# Patient Record
Sex: Female | Born: 1965 | Race: White | Hispanic: No | Smoking: Current every day smoker
Health system: Southern US, Community
[De-identification: ages and names within clinical notes are randomized; demographics above are authoritative.]

## PROBLEM LIST (undated history)

## (undated) DIAGNOSIS — T8859XA Other complications of anesthesia, initial encounter: Secondary | ICD-10-CM

## (undated) DIAGNOSIS — T4145XA Adverse effect of unspecified anesthetic, initial encounter: Secondary | ICD-10-CM

## (undated) DIAGNOSIS — M797 Fibromyalgia: Secondary | ICD-10-CM

## (undated) DIAGNOSIS — F419 Anxiety disorder, unspecified: Secondary | ICD-10-CM

## (undated) DIAGNOSIS — N289 Disorder of kidney and ureter, unspecified: Secondary | ICD-10-CM

## (undated) HISTORY — PX: CHOLECYSTECTOMY: SHX55

## (undated) HISTORY — PX: BREAST SURGERY: SHX581

## (undated) HISTORY — PX: LITHOTRIPSY: SUR834

---

## 1987-01-12 HISTORY — PX: WISDOM TOOTH EXTRACTION: SHX21

## 1999-01-23 ENCOUNTER — Other Ambulatory Visit: Admission: RE | Admit: 1999-01-23 | Discharge: 1999-01-23 | Payer: Self-pay | Admitting: Obstetrics and Gynecology

## 1999-02-19 ENCOUNTER — Encounter (INDEPENDENT_AMBULATORY_CARE_PROVIDER_SITE_OTHER): Payer: Self-pay

## 1999-02-19 ENCOUNTER — Observation Stay (HOSPITAL_COMMUNITY): Admission: RE | Admit: 1999-02-19 | Discharge: 1999-02-20 | Payer: Self-pay | Admitting: Surgery

## 1999-02-19 ENCOUNTER — Encounter: Payer: Self-pay | Admitting: Surgery

## 2000-02-29 ENCOUNTER — Other Ambulatory Visit: Admission: RE | Admit: 2000-02-29 | Discharge: 2000-02-29 | Payer: Self-pay | Admitting: Obstetrics and Gynecology

## 2001-05-15 ENCOUNTER — Inpatient Hospital Stay (HOSPITAL_COMMUNITY): Admission: AD | Admit: 2001-05-15 | Discharge: 2001-05-15 | Payer: Self-pay | Admitting: *Deleted

## 2001-05-22 ENCOUNTER — Other Ambulatory Visit: Admission: RE | Admit: 2001-05-22 | Discharge: 2001-05-22 | Payer: Self-pay | Admitting: Obstetrics and Gynecology

## 2001-06-04 ENCOUNTER — Inpatient Hospital Stay (HOSPITAL_COMMUNITY): Admission: AD | Admit: 2001-06-04 | Discharge: 2001-06-06 | Payer: Self-pay | Admitting: *Deleted

## 2001-06-06 ENCOUNTER — Ambulatory Visit (HOSPITAL_BASED_OUTPATIENT_CLINIC_OR_DEPARTMENT_OTHER): Admission: RE | Admit: 2001-06-06 | Discharge: 2001-06-06 | Payer: Self-pay | Admitting: Urology

## 2001-11-26 ENCOUNTER — Inpatient Hospital Stay (HOSPITAL_COMMUNITY): Admission: AD | Admit: 2001-11-26 | Discharge: 2001-11-26 | Payer: Self-pay | Admitting: Obstetrics and Gynecology

## 2001-11-27 ENCOUNTER — Inpatient Hospital Stay (HOSPITAL_COMMUNITY): Admission: AD | Admit: 2001-11-27 | Discharge: 2001-11-29 | Payer: Self-pay | Admitting: Obstetrics and Gynecology

## 2001-12-01 ENCOUNTER — Ambulatory Visit (HOSPITAL_BASED_OUTPATIENT_CLINIC_OR_DEPARTMENT_OTHER): Admission: RE | Admit: 2001-12-01 | Discharge: 2001-12-01 | Payer: Self-pay | Admitting: *Deleted

## 2001-12-25 ENCOUNTER — Ambulatory Visit (HOSPITAL_BASED_OUTPATIENT_CLINIC_OR_DEPARTMENT_OTHER): Admission: RE | Admit: 2001-12-25 | Discharge: 2001-12-25 | Payer: Self-pay | Admitting: Urology

## 2001-12-28 ENCOUNTER — Ambulatory Visit (HOSPITAL_BASED_OUTPATIENT_CLINIC_OR_DEPARTMENT_OTHER): Admission: RE | Admit: 2001-12-28 | Discharge: 2001-12-28 | Payer: Self-pay | Admitting: Urology

## 2001-12-28 ENCOUNTER — Encounter: Payer: Self-pay | Admitting: Urology

## 2002-01-09 ENCOUNTER — Other Ambulatory Visit: Admission: RE | Admit: 2002-01-09 | Discharge: 2002-01-09 | Payer: Self-pay | Admitting: Obstetrics and Gynecology

## 2003-01-12 HISTORY — PX: BREAST SURGERY: SHX581

## 2003-01-18 ENCOUNTER — Other Ambulatory Visit: Admission: RE | Admit: 2003-01-18 | Discharge: 2003-01-18 | Payer: Self-pay | Admitting: Obstetrics and Gynecology

## 2003-12-09 ENCOUNTER — Ambulatory Visit: Payer: Self-pay | Admitting: Family Medicine

## 2004-01-12 HISTORY — PX: DILATION AND CURETTAGE OF UTERUS: SHX78

## 2004-01-23 ENCOUNTER — Ambulatory Visit: Payer: Self-pay | Admitting: Family Medicine

## 2004-02-11 ENCOUNTER — Other Ambulatory Visit: Admission: RE | Admit: 2004-02-11 | Discharge: 2004-02-11 | Payer: Self-pay | Admitting: Obstetrics and Gynecology

## 2004-03-12 ENCOUNTER — Ambulatory Visit: Payer: Self-pay | Admitting: Family Medicine

## 2004-05-13 ENCOUNTER — Ambulatory Visit: Payer: Self-pay | Admitting: Family Medicine

## 2004-07-08 ENCOUNTER — Ambulatory Visit: Payer: Self-pay | Admitting: Family Medicine

## 2004-12-02 ENCOUNTER — Ambulatory Visit: Payer: Self-pay | Admitting: Family Medicine

## 2007-04-03 ENCOUNTER — Encounter: Admission: RE | Admit: 2007-04-03 | Discharge: 2007-04-03 | Payer: Self-pay | Admitting: Orthopedic Surgery

## 2007-06-01 ENCOUNTER — Ambulatory Visit (HOSPITAL_COMMUNITY): Admission: RE | Admit: 2007-06-01 | Discharge: 2007-06-01 | Payer: Self-pay | Admitting: Urology

## 2010-05-29 NOTE — Op Note (Signed)
[facility]  Patient:    April Huang, April Huang Visit Number: [id number] MRN: [id number]          Service Type: NES Location: NESC Attending Physician:  Trisha Mangle Dictated by:   Veverly Fells Vernie Ammons, M.D. Proc. Date: 06/06/01 Admit Date:  06/06/2001   CC:         Trevor Iha, M.D.   Operative Report  PREOPERATIVE DIAGNOSES: 1. Left renal calculus. 2. Persistent left flank pain.  POSTOPERATIVE DIAGNOSES: 1. Left renal calculus. 2. Persistent left flank pain.  PROCEDURE: 1. Cystoscopy. 2. Left ureteroscopy and double-J stent placement.  SURGEON:  Mark C. Vernie Ammons, M.D.  ANESTHESIA:  General.  SPECIMENS:  None.  ESTIMATED BLOOD LOSS:  Less than 1 cc.  DRAINS:  4.5 French, 26 cm left double-J stent with no string.  COMPLICATIONS:  None.  INDICATIONS:  The patient is a 45 year old white female, who has a known history of a left kidney stone, who has been followed by me for some time. She presented recently with the left flank, and an ultrasound in my office received the large stone in her left kidney and not changed position and, although there was no hydronephrosis, it was felt that with some hematuria present and the fact that she has passed stones before and was having similar pain, that this was most likely a small stone passing.  I wanted to give her some time, and she presented to my office a couple of weeks ago, again with some left flank pain, but the ultrasound did not reveal any hydronephrosis at that time.  I therefore continued on a conservative course, although she required admission over the weekend for pain control.  At this time, she continues to have left flank pain, and her urinalysis had hematuria noted with moderate amount of blood but no leukocyte esterase or nitrites.  Her white count was elevated to 15.1.  Her electrolytes are normal.  She was hydrated and given narcotic analgesics but continued to have flank  pain.  We discussed diagnostic evaluation with the ureteroscopy since she wants to avoid any form of x-rays.  I have discussed with her and the fact that I would leave the stent regardless.  She understands and has agreed to proceed.  DESCRIPTION OF OPERATION:  After informed consent, the patient brought to the major OR, placed on the table, and administered general anesthesia and then moved to the dorsal lithotomy position.  Her genitalia was sterilely prepped and draped, and a 6 French rigid ureteroscope was then introduced per urethra and into the bladder.  The bladder appeared normal on brief inspection with the ureteroscope. The left ureteral orifice was identified, and I was easily able to pass the ureteroscope into the left orifice and up the left ureter under direct visualization.  I did not note any irritation, inflammation, or stones.  I was able to pass the scope nearly to the level of the UPJ, and I therefore passed the 0.038 inch floppy tip guidewire through the ureteroscope into the area of the left kidney.  It passed easily with no resistance in there.  Therefore, I left the guidewire in place and removed the ureteroscope, backloaded the cystoscope and passed the double-J stent over the guidewire which again passed easily up to the level of the kidney.  I then removed the guidewire with good curl being noted in the bladder cystoscopically.  I did not use any radiation to confirm the position of the stent in the upper  renal region and if there is any question, then either a shielded KUB or renal ultrasound could be performed in the future.  At this point, I plan to leave the double-J stent in place.  She did receive antibiotics, and we will see her back in one week for follow-up.  At this point, I will hold off giving her any anticholinergics, see if she is bothered by the stent in any way.  Eventually, once she delivers, I can then do further formal studies of the kidneys  and ureters. Dictated by:   Veverly Fells Vernie Ammons, M.D. Attending Physician:  Trisha Mangle DD:  06/06/01 TD:  06/07/01 Job: [id number]

## 2010-05-29 NOTE — Op Note (Signed)
NAME:  April Huang, April Huang                         ACCOUNT NO.:  [id number]   MEDICAL RECORD NO.:  [id number]                   PATIENT TYPE:  AMB   LOCATION:  [facility]                                 FACILITY:  [facility]   PHYSICIAN:  Mark C. Vernie Ammons, M.D.               DATE OF BIRTH:  1965-07-30   DATE OF PROCEDURE:  12/25/2001  DATE OF DISCHARGE:                                 OPERATIVE REPORT   PREOPERATIVE DIAGNOSIS:  Left ureteropelvic junction stone.   POSTOPERATIVE DIAGNOSIS:  Left renal calculus.   OPERATION PERFORMED:  Cystoscopy, left retrograde pyelogram with  interpretation, left double-J stent (no string).   SURGEON:  Mark C. Vernie Ammons, M.D.   ANESTHESIA:  General.   ESTIMATED BLOOD LOSS:  None.   SPECIMENS:  None.   DRAINS:  4.5 French 26 cm double-J stent in the left ureter.   COMPLICATIONS:  None.   INDICATIONS FOR PROCEDURE:  The patient is a 45 year old white female who  had recently treated right ureteral stone.  She had a known left renal  calculus that was not causing obstruction or pain and we did discussed  eventual need to treat that.  She wanted to try to get over her first  procedure and she was nursing a newborn baby, wanted to wait a little while  before proceeding with that.  However, she presented to my office on Friday  in extreme pain and was found to have the stone in the left kidney move into  the UPJ region.  It was a very large stone measuring over 1.5 cm and she was  given parenteral narcotics.  Her pain was controlled and she has done well  since but we discussed the need for stent placement prior to lithotripsy due  to large size of the stones.   DESCRIPTION OF PROCEDURE:  After informed consent, the patient was brought  to the major operating room, placed on the table and administered general  anesthesia and moved to the dorsal lithotomy position.  The cystoscope was  placed in the bladder and the 12 degree lens attached.  The left ureteral  orifice was identified and an open ended ureteral catheter was placed in the  orifice and left retrograde pyelogram was performed in standard fashion.  I  noted that the stone had moved away from the UPJ and was now in the renal  pelvis which was very small.   I removed the ureteral catheter and inserted a 0.038 inch floppy tip guide  wire up the left ureter into the renal pelvis and passed the stent over the  guidewire into the renal pelvis under fluoroscopic control removing the  guidewire and the stent was noted to be in good position with good curl in  the bladder.  The bladder was drained, the cystoscope  was removed.  The patient received B&O suppository and will receive Pyridium  in the recovery room and  then proceed with lithotripsy this Thursday.                                                Mark C. Vernie Ammons, M.D.    MCO/MEDQ  D:  12/25/2001  T:  12/25/2001  Job:  [id number]

## 2010-05-29 NOTE — Discharge Summary (Signed)
[facility]  Patient:    April Huang, April Huang Visit Number: [id number] MRN: [id number]          Service Type: NES Location: NESC Attending Physician:  Trisha Mangle Dictated by:   Julio Sicks, N.P. Admit Date:  06/06/2001 Discharge Date: 06/06/2001                             Discharge Summary  ADMITTING DIAGNOSES:             1. Intrauterine pregnancy at 12 weeks                                     estimated gestational age.                                  2. Left flank pain, probable urolithiasis.  DISCHARGE DIAGNOSES:             1. Intrauterine pregnancy at 63 weeks                                     estimated gestational age.                                  2. Urolithiasis.  CHIEF COMPLAINT:                 The patient is a 45 year old white married female gravida 2, para 1 that was admitted to [facility] due to complaints of left flank pain.  The pain has increased over the last few days.  HISTORY OF PRESENT ILLNESS:      The patient has a history of a renal calculi. She has passed 25 stones in the past.  She has never undergone any surgery. She denies fever or gross hematuria.  The patient was previously hospitalized with this pregnancy approximately 3 weeks ago for observation and pain control.  PAST MEDICAL HISTORY:            The patient is in good health.  She has a history of renal calculi since the age of 15.  PAST SURGICAL HISTORY:           Wisdom teeth extraction in 1990, laparoscopic cholecystectomy in 2001.  OBSTETRICAL HISTORY:             Gravida 2, para 1 with a spontaneous vaginal delivery in 1996 without complication.  Current pregnancy: Last menstrual period March 10, 2001 with estimated gestational age of [redacted] weeks.  CURRENT MEDICATIONS:             Prenatal vitamins, Vicodin p.r.n., Zofran p.r.n.  ALLERGIES:                       MACRODANTIN, PENICILLIN - REACTION NOTED TO BE  HIVES.  PHYSICAL EXAMINATION:  VITAL SIGNS:                     Temperature 97.1, pulse 77, respirations 20, blood pressure 106/66.  GENERAL:  She is a well-developed, well-nourished female with left flank pain.  HEENT:                           Within normal limits.  NECK:                            Supple without adenopathy or thyromegaly.  HEART/LUNGS:                     Clear.  BREASTS:                         Examination deferred.  ABDOMEN:                         Gravid, nontender with left flank pain, CVA tenderness.  EXTREMITIES/NEUROLOGIC:          Grossly normal.  PELVIC:                          Examination deferred.  ADMITTING DIAGNOSIS:             Intrauterine pregnancy at 12 weeks with                                  urolithiasis.  PLAN:                            IV hydration, IV pain management and urology referral.  HOSPITAL COURSE:                 The patient was admitted to [facility] for IV hydration and pain management.  The patient is a gravida 2, para 1 at 64 weeks estimated gestational age with complaints of left flank, probable urolithiasis.  IV fluids were administered.  Urinalysis was obtained by catheter.  IV pain medications were given with pain relief. All urines were strained for calculi.  The patient continued to have left flank pain.  She complained of some nausea and vomiting with little relief with Phenergan.  Abdomen was soft, bowel sounds were auscultated, fetal heart tones were in the 160s.  Urology referral was made.  Urology evaluated the patient and decision was made to transfer the patient to [facility] for urethroscopy and stent.  The patient was taken by _______________ to that facility.  CONDITION ON DISCHARGE:          Same.  DIET:                            N.p.o.  ACTIVITY:                        Up as tolerated.  FOLLOWUP:                        The  patient is to follow up in the OB office in approximately 4 weeks. Dictated by:   Julio Sicks, N.P. Attending Physician:  Trisha Mangle DD:  06/19/01 TD:  06/20/01 Job: [id number]

## 2010-05-29 NOTE — Op Note (Signed)
NAME:  April Huang, April Huang                         ACCOUNT NO.:  [id number]   MEDICAL RECORD NO.:  [id number]                   PATIENT TYPE:  AMB   LOCATION:  NESC                                 FACILITY:  [facility]   PHYSICIAN:  Mark C. Vernie Ammons, M.D.               DATE OF BIRTH:  April 11, 1965   DATE OF PROCEDURE:  12/01/2001  DATE OF DISCHARGE:                                 OPERATIVE REPORT   PREOPERATIVE DIAGNOSIS:  Right ureteral calculus.   POSTOPERATIVE DIAGNOSIS:  Right ureteral calculus.   PROCEDURES:  1. Cystoscopy.  2. Right retrograde pyelogram with interpretation.  3. Right ureteroscopy and in-situ laser lithotripsy of ureteral calculus     with extraction and double J stent placement.   SURGEON:  Mark C. Vernie Ammons, M.D.   ANESTHESIA:  General.   DRAINS:  A 4.5 French 26 cm double J stent in the right ureter.   ESTIMATED BLOOD LOSS:  Less than 1 cc.   SPECIMENS:  Stone given to patient.   COMPLICATIONS:  None.   INDICATIONS:  The patient is a 45 year old white female who has had a long-  standing history of kidney stones.  She was pregnant and was being followed  for renal calculi.  She delivered on Monday and then began to have severe  flank pain yesterday.  She was seen in my office and found to have a large 6  x 9 mm distal right ureteral calculus with hydronephrosis and high-grade  obstruction.  She is brought to the OR today for ureteroscopic extraction of  the stone.   DESCRIPTION OF PROCEDURE:  After informed consent, patient brought to the  major OR placed on the table, and administered general anesthesia and moved  to the dorsal lithotomy position.  Her genitalia was sterilely prepped and  draped, and a 6 French rigid ureteroscope was then passed per urethra into  the bladder.  The right ureteral orifice was identified, and the scope was  easily passed into the orifice.  The stone was identified.  A retrograde  pyelogram was then performed under direct  fluoroscopic control, and it was  noted in the distal ureter and with proximal hydronephrosis.  No proximal  stones were identified.   I then placed a 0.038 inch floppy-tip guidewire beyond the stone into the  area of the renal pelvis under fluoroscopic control, removed the  ureteroscope, and inserted the cystoscope over the guidewire, and then used  a ureteral dilating balloon.  I dilated the distal ureter and ureteral  orificial region with a 4 cm length 15 French dilating balloon.  I then  removed the dilating balloon and reinserted the ureteroscope and engaged the  stone with a nitinol basket but was unable to extract it due to its large  size.  I disengaged the stone and inserted the holmium laser fiber,  fragmenting the stone into approximately four parts, after which I was  able  to grasp each of these with the nitinol basket and subsequently removed all  the fragments from the ureter.   The cystoscope was then back loaded over the guidewire and the double J  stent passed over the guidewire into the renal pelvis.  Good curl was noted  there and in the bladder.  I then drained the bladder, and the patient was  awakened and taken to the recovery room in stable and satisfactory  condition.  She tolerated the procedure well.  There were no intraoperative  complications.  She received a B&O suppository and 30 mg of Toradol IV as  well as intravenous Kefzol.  Since she is breast-feeding, I will not  administer antispasmodics.  She used some Percocet for pain, and I will  recommend that she continue to do that but as soon as she is able to stop,  24 hours after I think she can begin breast-feeding again.  A string was  left affixed to the distal portion of the stent, and the stent will be  removed in my office next week.                                               Mark C. Vernie Ammons, M.D.    MCO/MEDQ  D:  12/01/2001  T:  12/01/2001  Job:  [id number]

## 2010-05-29 NOTE — H&P (Signed)
   NAMESHANDA, CADOTTE NO.:  [id number]   MEDICAL RECORD NO.:  [id number]                   PATIENT TYPE:   LOCATION:                                       FACILITY:   PHYSICIAN:  Duke Salvia. Marcelle Overlie, M.D.            DATE OF BIRTH:   DATE OF ADMISSION:  11/27/2001  DATE OF DISCHARGE:                                HISTORY & PHYSICAL   CHIEF COMPLAINT:  Pregnancy induced hypertension at term.   HISTORY OF PRESENT ILLNESS:  A 45 year old G2, P1 at 37-2/8 weeks was seen  in triage over this past weekend with pelvic pressure.  PIH labs drawn there  were negative.  Blood pressure today 132/100, 1+ proteinuria with 2 to 3+  lower extremity edema, reflexes 1 to 2+, no clonus.  Will admit now for  induction at term with mild PIH.   One hour GTT this pregnancy was 108, blood type was A+, rubella titer was  positive.  History of positive group B strep.   ALLERGIES:  MACRODANTIN and AMOXICILLIN.   OBSTETRICAL HISTORY:  One vaginal delivery at term without complications.  She had history of positive group B strep at that time.   PAST MEDICAL HISTORY:  This is significant for history of kidney stones.   PAST SURGICAL HISTORY:  She had a cholecystectomy and wisdom teeth  extraction.   PHYSICAL EXAMINATION:  VITAL SIGNS:  Temperature 98.2, blood pressure  130/100.  HEENT:  Unremarkable.  NECK:  Supple without masses.  LUNGS:  Clear.  CARDIOVASCULAR:  Regular rate and rhythm without murmurs, rubs, or gallops.  BREASTS:  Not examined.  ABDOMEN:  Term fundal height.  Fetal heart rate 140.  PELVIC:  Cervix was 2, 80% vertex, -2.  EXTREMITIES:  There were 2+ reflexes,  no clonus. There was 2+ pitting  edema.   IMPRESSION:  Mild pregnancy induced hypertension at 37-2/7 weeks with  favorable cervix.   PLAN:  Artificial rupture of membranes induction, IV penicillin prophylaxis.                                                 Richard M. Marcelle Overlie,  M.D.    RMH/MEDQ  D:  11/27/2001  T:  11/27/2001  Job:  [id number]

## 2010-10-07 LAB — PREGNANCY, URINE: Preg Test, Ur: NEGATIVE

## 2011-03-12 HISTORY — PX: OTHER SURGICAL HISTORY: SHX169

## 2011-04-07 ENCOUNTER — Other Ambulatory Visit: Payer: Self-pay | Admitting: Obstetrics and Gynecology

## 2011-04-07 DIAGNOSIS — R928 Other abnormal and inconclusive findings on diagnostic imaging of breast: Secondary | ICD-10-CM

## 2011-04-12 ENCOUNTER — Ambulatory Visit
Admission: RE | Admit: 2011-04-12 | Discharge: 2011-04-12 | Disposition: A | Discharge status: Home / Self Care | Payer: 59 | Source: Ambulatory Visit | Attending: Obstetrics and Gynecology | Admitting: Obstetrics and Gynecology

## 2011-04-12 ENCOUNTER — Other Ambulatory Visit: Payer: Self-pay | Admitting: Obstetrics and Gynecology

## 2011-04-12 DIAGNOSIS — R928 Other abnormal and inconclusive findings on diagnostic imaging of breast: Secondary | ICD-10-CM

## 2011-04-13 ENCOUNTER — Ambulatory Visit
Admission: RE | Admit: 2011-04-13 | Discharge: 2011-04-13 | Disposition: A | Discharge status: Home / Self Care | Payer: 59 | Source: Ambulatory Visit | Attending: Obstetrics and Gynecology | Admitting: Obstetrics and Gynecology

## 2011-04-13 DIAGNOSIS — R928 Other abnormal and inconclusive findings on diagnostic imaging of breast: Secondary | ICD-10-CM

## 2011-09-26 ENCOUNTER — Emergency Department (HOSPITAL_COMMUNITY)
Admission: EM | Admit: 2011-09-26 | Discharge: 2011-09-27 | Disposition: A | Discharge status: Home / Self Care | Payer: 59 | Attending: Emergency Medicine | Admitting: Emergency Medicine

## 2011-09-26 ENCOUNTER — Encounter (HOSPITAL_COMMUNITY): Payer: Self-pay | Admitting: *Deleted

## 2011-09-26 ENCOUNTER — Emergency Department (HOSPITAL_COMMUNITY): Payer: 59

## 2011-09-26 DIAGNOSIS — N201 Calculus of ureter: Secondary | ICD-10-CM | POA: Insufficient documentation

## 2011-09-26 DIAGNOSIS — Z79899 Other long term (current) drug therapy: Secondary | ICD-10-CM | POA: Insufficient documentation

## 2011-09-26 DIAGNOSIS — N390 Urinary tract infection, site not specified: Secondary | ICD-10-CM

## 2011-09-26 DIAGNOSIS — N132 Hydronephrosis with renal and ureteral calculous obstruction: Secondary | ICD-10-CM

## 2011-09-26 DIAGNOSIS — Z9089 Acquired absence of other organs: Secondary | ICD-10-CM | POA: Insufficient documentation

## 2011-09-26 HISTORY — DX: Disorder of kidney and ureter, unspecified: N28.9

## 2011-09-26 LAB — BASIC METABOLIC PANEL
BUN: 12 mg/dL (ref 6–23)
CO2: 25 mEq/L (ref 19–32)
Chloride: 103 mEq/L (ref 96–112)
GFR calc Af Amer: >90 mL/min (ref >90)
Glucose, Bld: 93 mg/dL (ref 70–99)
Potassium: 3.3 mEq/L — ABNORMAL LOW (ref 3.5–5.1)

## 2011-09-26 LAB — URINALYSIS, ROUTINE W REFLEX MICROSCOPIC
Bilirubin Urine: NEGATIVE
Glucose, UA: NEGATIVE mg/dL
Nitrite: NEGATIVE
Specific Gravity, Urine: 1.02 (ref 1.005–1.030)
pH: 8 (ref 5.0–8.0)

## 2011-09-26 LAB — URINE MICROSCOPIC-ADD ON

## 2011-09-26 MED ORDER — HYDROMORPHONE HCL PF 2 MG/ML IJ SOLN: 1.0000 mg | Freq: Once | INTRAMUSCULAR | Status: AC

## 2011-09-26 MED ORDER — CIPROFLOXACIN IN D5W 400 MG/200ML IV SOLN
400.0000 mg | Freq: Once | INTRAVENOUS | Status: AC
  Administered 2011-09-26: 400 mg via INTRAVENOUS
  Filled 2011-09-26: qty 200

## 2011-09-26 MED ORDER — ONDANSETRON HCL 4 MG/2ML IJ SOLN
4.0000 mg | Freq: Once | INTRAMUSCULAR | Status: AC
  Administered 2011-09-26: 4 mg via INTRAVENOUS
  Filled 2011-09-26: qty 2

## 2011-09-26 MED ORDER — HYDROMORPHONE HCL PF 2 MG/ML IJ SOLN
1.0000 mg | Freq: Once | INTRAMUSCULAR | Status: AC
  Administered 2011-09-26: 1 mg via INTRAVENOUS
  Filled 2011-09-26: qty 1

## 2011-09-26 MED ORDER — HYDROMORPHONE HCL PF 1 MG/ML IJ SOLN
INTRAMUSCULAR | Status: AC
  Administered 2011-09-26: 1 mg
  Filled 2011-09-26: qty 1

## 2011-09-26 MED ORDER — POTASSIUM CHLORIDE CRYS ER 20 MEQ PO TBCR
20.0000 meq | EXTENDED_RELEASE_TABLET | Freq: Once | ORAL | Status: AC
  Administered 2011-09-26: 20 meq via ORAL
  Filled 2011-09-26: qty 1

## 2011-09-26 MED ORDER — SODIUM CHLORIDE 0.9 % IV SOLN
Freq: Once | INTRAVENOUS | Status: AC
  Administered 2011-09-26: 20 mL/h via INTRAVENOUS

## 2011-09-26 MED ORDER — HYDROMORPHONE HCL PF 1 MG/ML IJ SOLN
1.0000 mg | Freq: Once | INTRAMUSCULAR | Status: AC
  Administered 2011-09-26: 1 mg via INTRAVENOUS
  Filled 2011-09-26: qty 1

## 2011-09-26 NOTE — ED Notes (Signed)
Pt c/o lower right sided back and abdominal pain with n/v. Pt has a history of kidney stones and states she has had same symptoms in the past

## 2011-09-26 NOTE — ED Notes (Signed)
Patient states that she is having severe pain in her back that is very similar to kidney stones that she has had in the past.  Pt sitting on the edge of bed, very tearful, family at bedside.

## 2011-09-26 NOTE — ED Provider Notes (Signed)
History     CSN: [id number]  Arrival date & time 09/26/11  2035   First MD Initiated Contact with Patient 09/26/11 2048      Chief Complaint  Patient presents with  . Flank Pain  . Abdominal Pain  . Back Pain  . Nausea  . Emesis    (Consider location/radiation/quality/duration/timing/severity/associated sxs/prior treatment) HPI Comments: April Huang presents with a 2 day history of right flank pain which became suddenly worse this evening at 6 pm.  She has increased urinary frequency,  But urinating only dark drops of urine,  Has been nauseated without emesis.  She does have a history of kidney stones which todays symptoms resemble.  She has had lithotripsy in the past,  But has also passed stones on her own,  The last occurred about 3 years ago.  She has taken tylenol without relief of pain.  Pain is intermittent and not relieved with movement or lying still.  She she reports slight improvement in the severity of pain with gentle pressure over her right flank.  The history is provided by the patient.    Past Medical History  Diagnosis Date  . Renal disorder     kidney stone    Past Surgical History  Procedure Date  . Cholecystectomy   . Breast surgery     reduction    History reviewed. No pertinent family history.  History  Substance Use Topics  . Smoking status: Never Smoker   . Smokeless tobacco: Not on file  . Alcohol Use: No    OB History    Grav Para Term Preterm Abortions TAB SAB Ect Mult Living                  Review of Systems  Constitutional: Negative for fever.  HENT: Negative for congestion, sore throat and neck pain.   Eyes: Negative.   Respiratory: Negative for chest tightness and shortness of breath.   Cardiovascular: Negative for chest pain.  Gastrointestinal: Positive for nausea. Negative for abdominal pain.  Genitourinary: Positive for dysuria, frequency and flank pain.  Musculoskeletal: Negative for joint swelling and arthralgias.    Skin: Negative.  Negative for rash and wound.  Neurological: Negative for dizziness, weakness, light-headedness, numbness and headaches.  Hematological: Negative.   Psychiatric/Behavioral: Negative.     Allergies  Amoxicillin and Macrodantin  Home Medications   Current Outpatient Rx  Name Route Sig Dispense Refill  . VITAMIN D 1000 UNITS PO TABS Oral Take 1,000 Units by mouth daily.    Marland Kitchen HYDROCODONE-ACETAMINOPHEN 5-325 MG PO TABS Oral Take 1 tablet by mouth every 6 (six) hours as needed. pain    . ONE-DAILY MULTI VITAMINS PO TABS Oral Take 1 tablet by mouth daily.    . EFFEXOR XR PO Oral Take 225 mg by mouth daily.    Marland Kitchen CIPROFLOXACIN HCL 500 MG PO TABS Oral Take 1 tablet (500 mg total) by mouth 2 (two) times daily. 20 tablet 0  . OXYCODONE-ACETAMINOPHEN 5-325 MG PO TABS Oral Take 1 tablet by mouth every 4 (four) hours as needed for pain. 20 tablet 0  . OXYCODONE-ACETAMINOPHEN 5-325 MG PO TABS Oral Take 2 tablets by mouth every 4 (four) hours as needed for pain. 6 tablet 0    BP 151/95  Pulse 84  Temp 97.3 F (36.3 C) (Oral)  Resp 20  Ht 5\' 5"  (1.651 m)  Wt 175 lb (79.379 kg)  BMI 29.12 kg/m2  SpO2 100%  Physical Exam  Nursing  note and vitals reviewed. Constitutional: She appears well-developed and well-nourished.  HENT:  Head: Normocephalic and atraumatic.  Eyes: Conjunctivae normal are normal.  Neck: Normal range of motion.  Cardiovascular: Normal rate, regular rhythm, normal heart sounds and intact distal pulses.   Pulmonary/Chest: Effort normal and breath sounds normal. She has no wheezes.  Abdominal: Soft. Bowel sounds are normal. She exhibits no distension. There is no tenderness. There is CVA tenderness. There is no rebound and no guarding.       Right cva ttp.  Musculoskeletal: Normal range of motion.  Neurological: She is alert.  Skin: Skin is warm and dry.  Psychiatric: She has a normal mood and affect.    ED Course  Procedures (including critical care  time)  Labs Reviewed  BASIC METABOLIC PANEL - Abnormal; Notable for the following:    Potassium 3.3 (*)     GFR calc non Af Amer 80 (*)     All other components within normal limits  URINALYSIS, ROUTINE W REFLEX MICROSCOPIC - Abnormal; Notable for the following:    APPearance CLOUDY (*)     Hgb urine dipstick LARGE (*)     Protein, ur 30 (*)     All other components within normal limits  URINE MICROSCOPIC-ADD ON - Abnormal; Notable for the following:    Squamous Epithelial / LPF FEW (*)     Bacteria, UA MANY (*)     All other components within normal limits   Ct Abdomen Pelvis Wo Contrast  09/26/2011  *RADIOLOGY REPORT*  Clinical Data: Right flank pain.History of stones.  CT ABDOMEN AND PELVIS WITHOUT CONTRAST  Technique:  Multidetector CT imaging of the abdomen and pelvis was performed following the standard protocol without intravenous contrast.  Comparison: 04/27/2007 CT  Findings: Marked right hydronephrosis secondary to a 6 x 8 mm proximal ureteral stone (image 39 series 6).   Additional tiny stones right mid and lower pole calyces, approximately 1 mm in size.  No left renal calculi.  Clear lung bases.  The unenhanced appearance of the liver, spleen, pancreas, and adrenal glands is normal.  Gallbladder surgically absent.  Normal appearing stomach, small bowel, and colon.  No appendiceal inflammation.  No pelvic masses.  Decompressed bladder.  IUD.  No free fluid or free air.  Negative osseous structures.  Disc disease L5-S1.  IMPRESSION: Obstructive uropathy right kidney secondary to a 6 x 8 mm proximal ureteral stone.  See comments above.   Original Report Authenticated By: Elsie Stain, M.D.      1. Ureteral stone with hydronephrosis   2. UTI (lower urinary tract infection)     Patient was given Cipro 400 mg IV prior to discharge home.  She was able to tolerate by mouth fluids and her pain was improved at time of discharge.  Recheck of her temperature was 97.1.  MDM  Right  proximal ureteral kidney stone with hydronephrosis and urinary infection.  Patient stable for discharge home but understands the need for followup tomorrow with her urologist.  She will call Dr. Vernie Ammons this morning for an office visit and further management.  She was prescribed Cipro and also given a prescription for Percocet and a Percocet prepack for pain relief overnight.  In the interim she understands to return here immediately for any worsened symptoms including fevers or uncontrolled vomiting or worse pain.     Burgess Amor, [place] 09/27/11 [phone]

## 2011-09-27 MED ORDER — OXYCODONE-ACETAMINOPHEN 5-325 MG PO TABS: 2.0000 | ORAL_TABLET | ORAL | Status: DC | PRN

## 2011-09-27 MED ORDER — ONDANSETRON 8 MG PO TBDP
8.0000 mg | ORAL_TABLET | Freq: Once | ORAL | Status: AC
  Administered 2011-09-27: 8 mg via ORAL
  Filled 2011-09-27: qty 1

## 2011-09-27 MED ORDER — CIPROFLOXACIN HCL 500 MG PO TABS: 500.0000 mg | ORAL_TABLET | Freq: Two times a day (BID) | ORAL | Status: DC

## 2011-09-27 MED ORDER — OXYCODONE-ACETAMINOPHEN 5-325 MG PO TABS: 1.0000 | ORAL_TABLET | ORAL | Status: DC | PRN

## 2011-09-27 MED ORDER — CIPROFLOXACIN HCL 500 MG PO TABS: 500.0000 mg | ORAL_TABLET | Freq: Two times a day (BID) | ORAL | Status: AC

## 2011-09-27 NOTE — ED Provider Notes (Signed)
Medical screening examination/treatment/procedure(s) were performed by non-physician practitioner and as supervising physician I was immediately available for consultation/collaboration. Devoria Albe, MD, Armando Gang   Ward Givens, MD 09/27/11 Jacinta Shoe

## 2011-09-28 ENCOUNTER — Other Ambulatory Visit: Payer: Self-pay | Admitting: Urology

## 2011-10-01 ENCOUNTER — Encounter (HOSPITAL_COMMUNITY): Payer: Self-pay | Admitting: Pharmacy Technician

## 2011-10-04 MED FILL — Oxycodone w/ Acetaminophen Tab 5-325 MG: ORAL | Qty: 6 | Status: AC

## 2011-10-15 ENCOUNTER — Encounter (HOSPITAL_COMMUNITY): Payer: Self-pay | Admitting: *Deleted

## 2011-10-15 NOTE — Progress Notes (Signed)
1030 Spoke with [employer] person on call [place] and she had me speak with Camelia Eng to confirm that I still needed to do urine pregnancy test even tho the patient was saying her partner had a vasectomy, if she refuses to give the urine when she comes in she will not receive the lithotripsy procedure it's [employer] to do the urine pregnancy test pre procedure.

## 2011-10-15 NOTE — Pre-Procedure Instructions (Signed)
Asked to bring blue folder the day of the procedure,insurance card,I.D. driver's license,wear comfortable clothing and have a driver for the day. Asked not to take Advil,Motrin,Ibuprofen,Aleve or any NSAIDS, Aspirin, or Toradol for 72 hours prior to procedure,  No vitamins or herbal medications 7 days prior to procedure. Has stopped Calcium, Vitamin D and Multivitamin. Instructed to take laxative per doctor's office instructions and eat a light dinner the evening before procedure.   To arrive at 1145 for lithotripsy  procedure.

## 2011-10-17 NOTE — H&P (Signed)
History of Present Illness        Nephrolithiasis: She underwent ESL of a left UPJ stone with good result in 5/09. There were a few small fragments that had fallen in the lower pole of the left kidney that appeared to have eventually cleared on a KUB done in 12/09.  Interval history: She went to the ER on 09/26/11 and a CT scan there revealed a 5 x 7 mm stone in the proximal right ureter. She had no left renal calculi and 2 sub-millimeter calcifications in the right kidney. she has been feeling much better since she was seen. She had severe pain which went the emergency room. The pain was located in her right flank region it was not relieved by positional change.   Past Medical History Problems  1. History of  Acute Cystitis 595.0 2. Rule out  Acute Cystitis 595.0 3. History of  Anxiety (Symptom) 300.00 4. History of  Depression 311 5. History of  Gross Hematuria 599.71 6. History of  Nephrolithiasis Of The Left Kidney V13.01 7. Possible  Ureteral Stone 592.1  Surgical History Problems  1. History of  Breast Surgery Reduction Procedure 2. History of  Cholecystectomy 3. History of  Cystoscopy With Insertion Of Ureteral Stent Left 4. History of  Cystoscopy With Ureteroscopy With Removal Of Calculus Right 5. History of  Lithotripsy  Current Meds 1. Co Q-10 100 MG Oral Capsule; Therapy: (Recorded:02Jan2008) to 2. Effexor XR 150 MG Oral Capsule Extended Release 24 Hour; Therapy: (Recorded:02Jan2008) to 3. Fish Oil CAPS; Therapy: (Recorded:02Jan2008) to 4. Multivitamins TABS; Therapy: (Recorded:02Jan2008) to 5. Prosed/DS 81.6 MG Oral Tablet; TAKE 1 TO 2 TABLETS TWICEA DAY AS NEEDED; Therapy:  26Feb2010 to (Evaluate:06Jun2010); Last Rx:26Feb2010 6. Tetracycline HCl CAPS; Therapy: (Recorded:14May2009) to 7. Trimethoprim 100 MG Oral Tablet; TAKE 1 TABLET As Directed; Therapy: 26Feb2010 to (Last  Rx:26Feb2010)  Allergies Medication  1. Macrodantin CAPS 2. Penicillins  Family  History Problems  1. Family history of  Family Health Status Number Of Children 1 son, 1 daughter 2. Maternal grandmother's history of  Stroke Syndrome V17.1 3. Maternal history of  Urologic Disorder V18.7  Social History Problems  1. Alcohol Use Occasionally 2. Caffeine Use 2 cups a day 3. Marital History - Divorced 4. Occupation: HR Manager 5. Tobacco Use V15.82 less than 1 pack per day for 6 years; quit 4 years ago.  Review of Systems Genitourinary, constitutional and gastrointestinal system(s) were reviewed and pertinent findings if present are noted.  Genitourinary: urinary frequency, urinary urgency and hematuria, but no dysuria.  Gastrointestinal: flank pain.    Vitals Vital Signs  BMI Calculated: 28.32 BSA Calculated: 1.85 Height: 5 ft 5 in Weight: 170 lb  Blood Pressure: 129 / 87 Heart Rate: 102  Physical Exam Constitutional: Well nourished and well developed. No acute distress.  ENT: The ears and nose are normal in appearance.  Neck: The appearance of the neck is normal and no neck mass is present.  Pulmonary: No respiratory distress and normal respiratory rhythm and effort.  Cardiovascular: Heart rate and rhythm are normal. No peripheral edema.  Abdomen: The abdomen is soft and nontender. No masses are palpated. Mild left CVA tenderness. No hernias are palpable. No hepatosplenomegaly noted.  Genitourinary: The bladder is tender.  Lymphatics: The femoral and inguinal nodes are not enlarged or tender.  Skin: Normal skin turgor, no visible rash and no visible skin lesions.  Neuro/Psych: Mood and affect are appropriate.     Assessment Assessed  1. Proximal  Ureteral Stone On The Right 592.1      Her KUB reveals the stone today to be in the proximal ureter. It has an irregular shape indicating that it is very possibly calcium oxalate dihydrate. It would most likely respond to lithotripsy. The stone measures 1 cm long by 5 mm wide. She's not having a lot of pain  now and she is on maximum medical management and we therefore discussed the treatment options. The first would be continued observation with medical expulsive therapy. The second is ureteroscopy with laser lithotripsy although the stone was fairly large and located in the upper ureter so based on its size, location and the fact that it appears to most likely be in part at least dihydrate she would be an excellent candidate for lithotripsy. I gone over the procedure with her in detail including its risks and complications. She would like to proceed with that if her stone does not pass spontaneously and so what I'm going to do is give her some stronger pain medication and then schedule her for lithotripsy 2 weeks out. That will give her time to pass her stone spontaneously and if it doesn't pass we will proceed with treatment.   Plan    1. Prescription for hydromorphone. 2. I gave her more Rapaflo for medical expulsive therapy. 3. She isscheduled for lithotripsy and contact me if she passes her stone in the interim.

## 2011-10-18 ENCOUNTER — Ambulatory Visit (HOSPITAL_COMMUNITY): Payer: 59

## 2011-10-18 ENCOUNTER — Ambulatory Visit (HOSPITAL_COMMUNITY)
Admission: RE | Admit: 2011-10-18 | Discharge: 2011-10-18 | Disposition: A | Discharge status: Home / Self Care | Payer: 59 | Source: Ambulatory Visit | Attending: Urology | Admitting: Urology

## 2011-10-18 ENCOUNTER — Encounter (HOSPITAL_COMMUNITY): Payer: Self-pay | Admitting: *Deleted

## 2011-10-18 ENCOUNTER — Encounter (HOSPITAL_COMMUNITY)
Admission: RE | Disposition: A | Discharge status: Home / Self Care | Payer: Self-pay | Source: Ambulatory Visit | Attending: Urology

## 2011-10-18 DIAGNOSIS — N201 Calculus of ureter: Secondary | ICD-10-CM | POA: Insufficient documentation

## 2011-10-18 HISTORY — DX: Anxiety disorder, unspecified: F41.9

## 2011-10-18 HISTORY — DX: Other complications of anesthesia, initial encounter: T88.59XA

## 2011-10-18 HISTORY — DX: Adverse effect of unspecified anesthetic, initial encounter: T41.45XA

## 2011-10-18 HISTORY — DX: Fibromyalgia: M79.7

## 2011-10-18 LAB — PREGNANCY, URINE: Preg Test, Ur: NEGATIVE

## 2011-10-18 SURGERY — LITHOTRIPSY, ESWL
Anesthesia: LOCAL | Laterality: Right

## 2011-10-18 MED ORDER — CIPROFLOXACIN HCL 500 MG PO TABS
500.0000 mg | ORAL_TABLET | ORAL | Status: AC
  Administered 2011-10-18: 500 mg via ORAL
  Filled 2011-10-18: qty 1

## 2011-10-18 MED ORDER — SODIUM CHLORIDE 0.9 % IV SOLN
INTRAVENOUS | Status: DC
  Administered 2011-10-18: 13:00:00 via INTRAVENOUS

## 2011-10-18 MED ORDER — DIAZEPAM 5 MG PO TABS
10.0000 mg | ORAL_TABLET | ORAL | Status: AC
  Administered 2011-10-18: 10 mg via ORAL
  Filled 2011-10-18: qty 2

## 2011-10-18 MED ORDER — DIPHENHYDRAMINE HCL 25 MG PO CAPS
25.0000 mg | ORAL_CAPSULE | ORAL | Status: AC
  Administered 2011-10-18: 25 mg via ORAL
  Filled 2011-10-18: qty 1

## 2011-10-18 MED ORDER — TAMSULOSIN HCL 0.4 MG PO CAPS: 0.4000 mg | ORAL_CAPSULE | ORAL | Status: DC

## 2011-10-18 MED ORDER — HYDROCODONE-ACETAMINOPHEN 10-325 MG PO TABS: 1.0000 | ORAL_TABLET | ORAL | Status: DC | PRN

## 2011-10-18 NOTE — Op Note (Signed)
See [facility] OP note scanned into chart. 

## 2011-10-18 NOTE — Interval H&P Note (Signed)
History and Physical Interval Note:  10/18/2011 2:13 PM  April Huang  has presented today for surgery, with the diagnosis of right  proximal ureteral stone  The various methods of treatment have been discussed with the patient and family. After consideration of risks, benefits and other options for treatment, the patient has consented to  Procedure(s) (LRB) with comments: EXTRACORPOREAL SHOCK WAVE LITHOTRIPSY (ESWL) (Right) as a surgical intervention .  The patient's history has been reviewed, patient examined, no change in status, stable for surgery.  I have reviewed the patient's chart and labs.  Questions were answered to the patient's satisfaction.     [place]

## 2014-07-30 ENCOUNTER — Other Ambulatory Visit: Payer: Self-pay | Admitting: Obstetrics and Gynecology

## 2014-08-01 LAB — CYTOLOGY - PAP

## 2015-06-25 ENCOUNTER — Other Ambulatory Visit: Payer: Self-pay | Admitting: Emergency Medicine

## 2015-06-25 DIAGNOSIS — M545 Low back pain: Secondary | ICD-10-CM

## 2015-06-25 DIAGNOSIS — M79605 Pain in left leg: Secondary | ICD-10-CM

## 2015-06-30 ENCOUNTER — Ambulatory Visit
Admission: RE | Admit: 2015-06-30 | Discharge: 2015-06-30 | Disposition: A | Discharge status: Home / Self Care | Payer: 59 | Source: Ambulatory Visit | Attending: Emergency Medicine | Admitting: Emergency Medicine

## 2015-06-30 DIAGNOSIS — M545 Low back pain: Secondary | ICD-10-CM

## 2015-06-30 DIAGNOSIS — M79605 Pain in left leg: Secondary | ICD-10-CM

## 2015-11-04 ENCOUNTER — Emergency Department (HOSPITAL_COMMUNITY): Payer: 59

## 2015-11-04 ENCOUNTER — Emergency Department (HOSPITAL_COMMUNITY)
Admission: EM | Admit: 2015-11-04 | Discharge: 2015-11-04 | Disposition: A | Discharge status: Home / Self Care | Payer: 59 | Attending: Emergency Medicine | Admitting: Emergency Medicine

## 2015-11-04 ENCOUNTER — Encounter (HOSPITAL_COMMUNITY): Payer: Self-pay | Admitting: *Deleted

## 2015-11-04 DIAGNOSIS — Y999 Unspecified external cause status: Secondary | ICD-10-CM | POA: Insufficient documentation

## 2015-11-04 DIAGNOSIS — Z7982 Long term (current) use of aspirin: Secondary | ICD-10-CM | POA: Diagnosis not present

## 2015-11-04 DIAGNOSIS — S6992XA Unspecified injury of left wrist, hand and finger(s), initial encounter: Secondary | ICD-10-CM | POA: Diagnosis present

## 2015-11-04 DIAGNOSIS — Y92410 Unspecified street and highway as the place of occurrence of the external cause: Secondary | ICD-10-CM | POA: Insufficient documentation

## 2015-11-04 DIAGNOSIS — S52592A Other fractures of lower end of left radius, initial encounter for closed fracture: Secondary | ICD-10-CM | POA: Insufficient documentation

## 2015-11-04 DIAGNOSIS — S62102A Fracture of unspecified carpal bone, left wrist, initial encounter for closed fracture: Secondary | ICD-10-CM

## 2015-11-04 DIAGNOSIS — S52692A Other fracture of lower end of left ulna, initial encounter for closed fracture: Secondary | ICD-10-CM | POA: Insufficient documentation

## 2015-11-04 DIAGNOSIS — Z9889 Other specified postprocedural states: Secondary | ICD-10-CM

## 2015-11-04 DIAGNOSIS — Y9389 Activity, other specified: Secondary | ICD-10-CM | POA: Diagnosis not present

## 2015-11-04 DIAGNOSIS — IMO0001 Reserved for inherently not codable concepts without codable children: Secondary | ICD-10-CM

## 2015-11-04 MED ORDER — FENTANYL CITRATE (PF) 100 MCG/2ML IJ SOLN
50.0000 ug | INTRAMUSCULAR | Status: DC | PRN
  Administered 2015-11-04: 50 ug via INTRAVENOUS
  Filled 2015-11-04: qty 2

## 2015-11-04 MED ORDER — PROPOFOL 10 MG/ML IV BOLUS
INTRAVENOUS | Status: AC | PRN
  Administered 2015-11-04: 40 mg via INTRAVENOUS

## 2015-11-04 MED ORDER — PROPOFOL 10 MG/ML IV BOLUS
0.5000 mg/kg | Freq: Once | INTRAVENOUS | Status: AC
  Administered 2015-11-04: 39.7 mg via INTRAVENOUS
  Filled 2015-11-04: qty 20

## 2015-11-04 MED ORDER — KETAMINE HCL-SODIUM CHLORIDE 100-0.9 MG/10ML-% IV SOSY
1.0000 mg/kg | PREFILLED_SYRINGE | Freq: Once | INTRAVENOUS | Status: AC
  Administered 2015-11-04: 79 mg via INTRAVENOUS
  Filled 2015-11-04: qty 10

## 2015-11-04 MED ORDER — FENTANYL CITRATE (PF) 100 MCG/2ML IJ SOLN
50.0000 ug | Freq: Once | INTRAMUSCULAR | Status: AC
  Administered 2015-11-04: 50 ug via INTRAVENOUS
  Filled 2015-11-04: qty 2

## 2015-11-04 MED ORDER — KETAMINE HCL 10 MG/ML IJ SOLN
INTRAMUSCULAR | Status: AC | PRN
  Administered 2015-11-04: 40 mg via INTRAVENOUS

## 2015-11-04 MED ORDER — OXYCODONE-ACETAMINOPHEN 5-325 MG PO TABS: 1.0000 | ORAL_TABLET | ORAL | 0 refills | Status: DC | PRN

## 2015-11-04 MED ORDER — MORPHINE SULFATE (PF) 4 MG/ML IV SOLN
4.0000 mg | Freq: Once | INTRAVENOUS | Status: AC
  Administered 2015-11-04: 4 mg via INTRAVENOUS
  Filled 2015-11-04: qty 1

## 2015-11-04 NOTE — Sedation Documentation (Signed)
Reduction successful by Juleen ChinaKohut, MD.

## 2015-11-04 NOTE — Progress Notes (Signed)
RT at bedside for conscious sedation. Patient tolerated well. O2 sats and RR remained within normal limits.

## 2015-11-04 NOTE — Discharge Instructions (Signed)
Please call orthopedic specialist tomorrow and schedule follow-up evaluation tomorrow. Please return to the emergency room immediately if you experience any new or worsening signs or symptoms. Please keep injury elevated

## 2015-11-04 NOTE — ED Notes (Signed)
Pt transported to xray 
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
## 2015-11-04 NOTE — ED Notes (Signed)
Bed: WA08 Expected date:  Expected time:  Means of arrival:  Comments: Hold for fast track 

## 2015-11-04 NOTE — ED Notes (Addendum)
Ketamine and propofol wasted in the pyxis w/ charge RN Stacy.
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
## 2017-03-18 ENCOUNTER — Observation Stay (HOSPITAL_COMMUNITY)
Admission: EM | Admit: 2017-03-18 | Discharge: 2017-03-19 | Disposition: A | Discharge status: Home / Self Care | Payer: 59 | Attending: Urology | Admitting: Urology

## 2017-03-18 ENCOUNTER — Encounter (HOSPITAL_COMMUNITY): Payer: Self-pay

## 2017-03-18 ENCOUNTER — Emergency Department (HOSPITAL_COMMUNITY): Payer: 59

## 2017-03-18 DIAGNOSIS — F17210 Nicotine dependence, cigarettes, uncomplicated: Secondary | ICD-10-CM | POA: Diagnosis not present

## 2017-03-18 DIAGNOSIS — F419 Anxiety disorder, unspecified: Secondary | ICD-10-CM | POA: Insufficient documentation

## 2017-03-18 DIAGNOSIS — N1330 Unspecified hydronephrosis: Secondary | ICD-10-CM | POA: Diagnosis present

## 2017-03-18 DIAGNOSIS — N289 Disorder of kidney and ureter, unspecified: Secondary | ICD-10-CM | POA: Diagnosis present

## 2017-03-18 DIAGNOSIS — M797 Fibromyalgia: Secondary | ICD-10-CM | POA: Insufficient documentation

## 2017-03-18 DIAGNOSIS — Z7982 Long term (current) use of aspirin: Secondary | ICD-10-CM | POA: Diagnosis not present

## 2017-03-18 DIAGNOSIS — N132 Hydronephrosis with renal and ureteral calculous obstruction: Principal | ICD-10-CM | POA: Insufficient documentation

## 2017-03-18 DIAGNOSIS — N139 Obstructive and reflux uropathy, unspecified: Secondary | ICD-10-CM | POA: Diagnosis present

## 2017-03-18 DIAGNOSIS — Z880 Allergy status to penicillin: Secondary | ICD-10-CM | POA: Insufficient documentation

## 2017-03-18 DIAGNOSIS — Z87442 Personal history of urinary calculi: Secondary | ICD-10-CM | POA: Insufficient documentation

## 2017-03-18 DIAGNOSIS — N179 Acute kidney failure, unspecified: Secondary | ICD-10-CM | POA: Insufficient documentation

## 2017-03-18 DIAGNOSIS — N200 Calculus of kidney: Secondary | ICD-10-CM

## 2017-03-18 DIAGNOSIS — Z79899 Other long term (current) drug therapy: Secondary | ICD-10-CM | POA: Diagnosis not present

## 2017-03-18 LAB — URINALYSIS, ROUTINE W REFLEX MICROSCOPIC
BACTERIA UA: NONE SEEN
BILIRUBIN URINE: NEGATIVE
GLUCOSE, UA: NEGATIVE mg/dL
Ketones, ur: NEGATIVE mg/dL
Nitrite: NEGATIVE
PROTEIN: 100 mg/dL — AB
Specific Gravity, Urine: 1.017 (ref 1.005–1.030)
pH: 5 (ref 5.0–8.0)

## 2017-03-18 LAB — BASIC METABOLIC PANEL
Anion gap: 15 (ref 5–15)
BUN: 24 mg/dL — AB (ref 6–20)
CHLORIDE: 107 mmol/L (ref 101–111)
CO2: 22 mmol/L (ref 22–32)
CREATININE: 1.6 mg/dL — AB (ref 0.44–1.00)
Calcium: 10.2 mg/dL (ref 8.9–10.3)
GFR calc Af Amer: 42 mL/min — ABNORMAL LOW (ref >60)
GFR, EST NON AFRICAN AMERICAN: 36 mL/min — AB (ref >60)
GLUCOSE: 118 mg/dL — AB (ref 65–99)
Potassium: 3.9 mmol/L (ref 3.5–5.1)
SODIUM: 144 mmol/L (ref 135–145)

## 2017-03-18 LAB — HCG, SERUM, QUALITATIVE: PREG SERUM: POSITIVE — AB

## 2017-03-18 LAB — HCG, QUANTITATIVE, PREGNANCY: hCG, Beta Chain, Quant, S: 6 m[IU]/mL — ABNORMAL HIGH (ref <5)

## 2017-03-18 MED ORDER — ONDANSETRON HCL 4 MG/2ML IJ SOLN
INTRAMUSCULAR | Status: AC
  Filled 2017-03-18: qty 2

## 2017-03-18 MED ORDER — HYDROMORPHONE HCL 1 MG/ML IJ SOLN
1.0000 mg | Freq: Once | INTRAMUSCULAR | Status: AC
  Administered 2017-03-18: 1 mg via INTRAVENOUS
  Filled 2017-03-18: qty 1

## 2017-03-18 MED ORDER — ONDANSETRON HCL 4 MG/2ML IJ SOLN
4.0000 mg | Freq: Three times a day (TID) | INTRAMUSCULAR | Status: DC | PRN
Start: 2017-03-18 — End: 2017-03-19
  Administered 2017-03-19: 4 mg via INTRAVENOUS
  Filled 2017-03-18: qty 2

## 2017-03-18 MED ORDER — KETOROLAC TROMETHAMINE 30 MG/ML IJ SOLN
30.0000 mg | Freq: Once | INTRAMUSCULAR | Status: AC
  Administered 2017-03-18: 30 mg via INTRAVENOUS
  Filled 2017-03-18: qty 1

## 2017-03-18 MED ORDER — ONDANSETRON HCL 4 MG/2ML IJ SOLN
INTRAMUSCULAR | Status: AC
  Administered 2017-03-18: 4 mg via INTRAVENOUS
  Filled 2017-03-18: qty 2

## 2017-03-18 MED ORDER — ONDANSETRON HCL 4 MG/2ML IJ SOLN
4.0000 mg | Freq: Once | INTRAMUSCULAR | Status: AC
  Administered 2017-03-18 (×2): 4 mg via INTRAVENOUS

## 2017-03-18 MED ORDER — HYDROMORPHONE HCL 1 MG/ML IJ SOLN
0.5000 mg | INTRAMUSCULAR | Status: DC | PRN
  Administered 2017-03-19: 0.5 mg via INTRAVENOUS
  Filled 2017-03-18: qty 1

## 2017-03-18 MED ORDER — SODIUM CHLORIDE 0.9 % IV SOLN
INTRAVENOUS | Status: AC
  Administered 2017-03-18: 23:00:00 via INTRAVENOUS

## 2017-03-18 NOTE — ED Provider Notes (Signed)
[facility] EMERGENCY DEPARTMENT Provider Note   CSN: [id number] Arrival date & time: 03/18/17  1707     History   Chief Complaint Chief Complaint  Patient presents with  . Flank Pain    HPI KIRSTAN FENTRESS is a 52 y.o. female.  HPI  The pt is a 52 y/o female - she has hx of multiple kidney stones which she measures at > 90 in the past, some which she has required lithotripsy, some with basket retrieval and has had stents placed in the past as well - the pt states that her pain started yesterday, R flank, intermittent, sharp and severe - took oxycodone yesterday with some relief - today came back and has been severe and unrelieved despite 2 more oxycodone - no vomiting or diarrhea, no blood in urine.  She has had similar pain in the past with prior kidney stones.    Urologist - Dr. Ihor Gully  Past Medical History:  Diagnosis Date  . Anxiety   . Complication of anesthesia    difficulting waking up  . Fibromyalgia   . Renal disorder    kidney stone    Patient Active Problem List   Diagnosis Date Noted  . Nephrolithiasis 03/18/2017  . Bilateral kidney stones 03/18/2017    Past Surgical History:  Procedure Laterality Date  . BREAST SURGERY     reduction  . CHOLECYSTECTOMY    . DILATION AND CURETTAGE OF UTERUS  2006  . LITHOTRIPSY  1191,4782  . Needle biopsy of breast  03-2011  . WISDOM TOOTH EXTRACTION  1989    OB History    Gravida Para Term Preterm AB Living   1             SAB TAB Ectopic Multiple Live Births                   Home Medications    Prior to Admission medications   Medication Sig Start Date End Date Taking? Authorizing Provider  aspirin-acetaminophen-caffeine (EXCEDRIN EXTRA STRENGTH) [phone] MG tablet Take 2 tablets by mouth daily as needed for headache.   Yes [provider]  EFFEXOR XR 75 MG 24 hr capsule Take 225 mg by mouth every evening.  10/03/15  Yes [provider]  fluticasone (FLONASE) 50 MCG/ACT nasal  spray Place 2 sprays into both nostrils daily as needed for allergies or rhinitis.   Yes [provider]  Heat Wraps (THERMACARE BACK/HIP) MISC Apply 1 patch topically daily as needed (for pain).   Yes [provider]  hydrochlorothiazide (HYDRODIURIL) 25 MG tablet Take 25 mg by mouth daily.  09/20/15  Yes [provider]  oxyCODONE-acetaminophen (PERCOCET/ROXICET) 5-325 MG tablet Take 1-2 tablets by mouth every 4 (four) hours as needed for severe pain. 11/04/15  Yes Hedges, Tinnie Gens, PA-C  oxymetazoline (AFRIN) 0.05 % nasal spray Place 2 sprays into both nostrils daily as needed for congestion.   Yes [provider]  pseudoephedrine-guaifenesin (MUCINEX D) 60-600 MG 12 hr tablet Take 1 tablet by mouth every 12 (twelve) hours as needed for congestion.   Yes [provider]    Family History No family history on file.  Social History Social History   Tobacco Use  . Smoking status: Current Every Day Smoker    Packs/day: 1.00    Types: Cigarettes  . Smokeless tobacco: Never Used  Substance Use Topics  . Alcohol use: Yes  . Drug use: No     Allergies   Amoxicillin  and Macrodantin [nitrofurantoin macrocrystal]   Review of Systems Review of Systems  All other systems reviewed and are negative.    Physical Exam Updated Vital Signs BP 125/88   Pulse 84   Temp 98.3 F (36.8 C) (Oral)   Resp 18   Ht 5\' 5"  (1.651 m)   Wt 77.1 kg (170 lb)   SpO2 96%   BMI 28.29 kg/m   Physical Exam  Constitutional: She appears well-developed and well-nourished. She appears distressed.  HENT:  Head: Normocephalic and atraumatic.  Mouth/Throat: Oropharynx is clear and moist. No oropharyngeal exudate.  Eyes: Conjunctivae and EOM are normal. Pupils are equal, round, and reactive to light. Right eye exhibits no discharge. Left eye exhibits no discharge. No scleral icterus.  Neck: Normal range of motion. Neck supple. No JVD present. No thyromegaly present.   Cardiovascular: Normal rate, regular rhythm, normal heart sounds and intact distal pulses. Exam reveals no gallop and no friction rub.  No murmur heard. Pulmonary/Chest: Effort normal and breath sounds normal. No respiratory distress. She has no wheezes. She has no rales.  Abdominal: Soft. Bowel sounds are normal. She exhibits no distension and no mass. There is no tenderness.  Soft NT abd - with R sided CVA ttp  Musculoskeletal: Normal range of motion. She exhibits no edema or tenderness.  Lymphadenopathy:    She has no cervical adenopathy.  Neurological: She is alert. Coordination normal.  Skin: Skin is warm and dry. No rash noted. No erythema.  Psychiatric: She has a normal mood and affect. Her behavior is normal.  Nursing note and vitals reviewed.    ED Treatments / Results  Labs (all labs ordered are listed, but only abnormal results are displayed) Labs Reviewed  BASIC METABOLIC PANEL - Abnormal; Notable for the following components:      Result Value   Glucose, Bld 118 (*)    BUN 24 (*)    Creatinine, Ser 1.60 (*)    GFR calc non Af Amer 36 (*)    GFR calc Af Amer 42 (*)    All other components within normal limits  HCG, SERUM, QUALITATIVE - Abnormal; Notable for the following components:   Preg, Serum WEAK POSITIVE (*)    All other components within normal limits  HCG, QUANTITATIVE, PREGNANCY - Abnormal; Notable for the following components:   hCG, Beta Chain, Quant, S 6 (*)    All other components within normal limits  URINALYSIS, ROUTINE W REFLEX MICROSCOPIC     Radiology Ct Renal Stone Study  Result Date: 03/18/2017 CLINICAL DATA:  52 y/o F; sharp intermittent flank pain starting last night. Nausea without vomiting. EXAM: CT ABDOMEN AND PELVIS WITHOUT CONTRAST TECHNIQUE: Multidetector CT imaging of the abdomen and pelvis was performed following the standard protocol without IV contrast. COMPARISON:  09/26/2011 CT abdomen and pelvis FINDINGS: Lower chest: No acute  abnormality. Hepatobiliary: No focal liver abnormality is seen. Status post cholecystectomy. No biliary dilatation. Pancreas: Unremarkable. No pancreatic ductal dilatation or surrounding inflammatory changes. Spleen: Normal in size without focal abnormality. 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 12 mm nephrolith within left mid ureter at L4 level (series 5, image 59) with moderate to severe proximal hydronephrosis. Punctate non-obstructing left interpolar kidney stone. 10 mm nephroliths within the right mid ureter at the L5 level with moderate proximal hydronephrosis. Multiple punctate nonobstructing stones in the right kidney. No obstructing stone is identified within the downstream ureters bilaterally or the urinary bladder. Midline  punctate density at bladder neck/urethra may represent a passing stone (series 2, image 85). Stomach/Bowel: Stomach is within normal limits. Appendix appears normal. No evidence of bowel wall thickening, distention, or inflammatory changes. Scattered sigmoid diverticulosis. Vascular/Lymphatic: No significant vascular findings are present. No enlarged abdominal or pelvic lymph nodes. Reproductive: Uterus and bilateral adnexa are unremarkable. Other: No abdominal wall hernia or abnormality. No abdominopelvic ascites. Musculoskeletal: No fracture is seen. IMPRESSION: 1. Moderate to severe left proximal hydronephrosis with 12 mm stone in the left mid ureter at L4 level. 2. Moderate right hydronephrosis with 10 mm stone in right mid ureter at L5 level. 3. Punctate calcification at bladder neck/urethra may represent passing stone. 4. Bilateral nonobstructive nephrolithiasis. 5. Scattered sigmoid diverticulosis. Electronically Signed   By: Mitzi Hansen M.D.   On: 03/18/2017 21:23    Procedures Procedures (including critical care time)  Medications Ordered in ED Medications  0.9 %  sodium chloride infusion (not  administered)  HYDROmorphone (DILAUDID) injection 0.5 mg (not administered)  ondansetron (ZOFRAN) injection 4 mg (not administered)  ketorolac (TORADOL) 30 MG/ML injection 30 mg (30 mg Intravenous Given 03/18/17 1747)  HYDROmorphone (DILAUDID) injection 1 mg (1 mg Intravenous Given 03/18/17 1747)  ondansetron (ZOFRAN) 4 MG/2ML injection (4 mg  Given 03/18/17 1752)  HYDROmorphone (DILAUDID) injection 1 mg (1 mg Intravenous Given 03/18/17 2157)  ondansetron (ZOFRAN) injection 4 mg (4 mg Intravenous Given 03/18/17 2202)     Initial Impression / Assessment and Plan / ED Course  I have reviewed the triage vital signs and the nursing notes.  Pertinent labs & imaging results that were available during my care of the patient were reviewed by me and considered in my medical decision making (see chart for details).     Tearful, colicky pain - R sided - soft and NT abd - makes Uretereolithiasis the likely culprit  EHR reviewed and shows last admission was 2013 for stones and last CT was in 2013 as well.  Will get repeat CT to see size and location - [place] not available  At this time in the hospital.  Labs show renal insufficiency  Bilateral obstructive uropathy with bilateral ureteral stones of 10 mm and 12 mm respectively.  Discussed with urologist, Dr. Berniece Salines, he has excepted the patient in transfer to [facility] long hospital.  Holding orders written.  Final Clinical Impressions(s) / ED Diagnoses   Final diagnoses:  Bilateral hydronephrosis  Acute bilateral obstructive uropathy  Bilateral kidney stones  Renal insufficiency      Eber Hong, MD 03/18/17 2215

## 2017-03-18 NOTE — Progress Notes (Signed)
43F with bilateral obstructing stones and acute renal failure.  Pain controllable with IV medications.  [facility].  Please page urology when the patient hits the floor of [facility].

## 2017-03-18 NOTE — ED Notes (Signed)
Informed consent for CT signed by pt due to positive preg

## 2017-03-18 NOTE — ED Triage Notes (Signed)
Pt reports she has hx of kidney stone. Pain started yesterday but increased approx one hour ago. Pt took 2 oxycodone without relief  No blood in urine

## 2017-03-19 ENCOUNTER — Observation Stay (HOSPITAL_COMMUNITY): Payer: 59

## 2017-03-19 ENCOUNTER — Other Ambulatory Visit: Payer: Self-pay

## 2017-03-19 ENCOUNTER — Observation Stay (HOSPITAL_COMMUNITY): Payer: 59 | Admitting: Certified Registered Nurse Anesthetist

## 2017-03-19 ENCOUNTER — Encounter (HOSPITAL_COMMUNITY): Payer: Self-pay | Admitting: *Deleted

## 2017-03-19 ENCOUNTER — Encounter (HOSPITAL_COMMUNITY)
Admission: EM | Disposition: A | Discharge status: Home / Self Care | Payer: Self-pay | Source: Home / Self Care | Attending: Emergency Medicine

## 2017-03-19 HISTORY — PX: CYSTOSCOPY W/ URETERAL STENT PLACEMENT: SHX1429

## 2017-03-19 LAB — CBC
HCT: 38.8 % (ref 36.0–46.0)
HEMOGLOBIN: 13 g/dL (ref 12.0–15.0)
MCH: 30.7 pg (ref 26.0–34.0)
MCHC: 33.5 g/dL (ref 30.0–36.0)
MCV: 91.7 fL (ref 78.0–100.0)
PLATELETS: 331 10*3/uL (ref 150–400)
RBC: 4.23 MIL/uL (ref 3.87–5.11)
RDW: 13.1 % (ref 11.5–15.5)
WBC: 10.6 10*3/uL — ABNORMAL HIGH (ref 4.0–10.5)

## 2017-03-19 LAB — BASIC METABOLIC PANEL
Anion gap: 8 (ref 5–15)
BUN: 26 mg/dL — ABNORMAL HIGH (ref 6–20)
CHLORIDE: 109 mmol/L (ref 101–111)
CO2: 27 mmol/L (ref 22–32)
CREATININE: 1.67 mg/dL — AB (ref 0.44–1.00)
Calcium: 9.3 mg/dL (ref 8.9–10.3)
GFR calc non Af Amer: 34 mL/min — ABNORMAL LOW (ref >60)
GFR, EST AFRICAN AMERICAN: 40 mL/min — AB (ref >60)
GLUCOSE: 101 mg/dL — AB (ref 65–99)
Potassium: 4.2 mmol/L (ref 3.5–5.1)
Sodium: 144 mmol/L (ref 135–145)

## 2017-03-19 LAB — SURGICAL PCR SCREEN
MRSA, PCR: NEGATIVE
Staphylococcus aureus: NEGATIVE

## 2017-03-19 SURGERY — CYSTOSCOPY, WITH RETROGRADE PYELOGRAM AND URETERAL STENT INSERTION
Anesthesia: General | Site: Ureter | Laterality: Bilateral

## 2017-03-19 MED ORDER — FENTANYL CITRATE (PF) 100 MCG/2ML IJ SOLN
INTRAMUSCULAR | Status: DC | PRN
  Administered 2017-03-19 (×2): 50 ug via INTRAVENOUS

## 2017-03-19 MED ORDER — CIPROFLOXACIN IN D5W 400 MG/200ML IV SOLN
INTRAVENOUS | Status: AC
  Filled 2017-03-19: qty 200

## 2017-03-19 MED ORDER — SODIUM CHLORIDE 0.9 % IV SOLN
INTRAVENOUS | Status: DC | PRN
  Administered 2017-03-19: 5 mL

## 2017-03-19 MED ORDER — BELLADONNA-OPIUM 16.2-30 MG RE SUPP
RECTAL | Status: AC
  Filled 2017-03-19: qty 1

## 2017-03-19 MED ORDER — PROPOFOL 500 MG/50ML IV EMUL
INTRAVENOUS | Status: DC | PRN
  Administered 2017-03-19: 200 ug/kg/min via INTRAVENOUS

## 2017-03-19 MED ORDER — PHENYLEPHRINE 40 MCG/ML (10ML) SYRINGE FOR IV PUSH (FOR BLOOD PRESSURE SUPPORT)
PREFILLED_SYRINGE | INTRAVENOUS | Status: DC | PRN
  Administered 2017-03-19 (×3): 80 ug via INTRAVENOUS

## 2017-03-19 MED ORDER — LIDOCAINE HCL 2 % EX GEL
CUTANEOUS | Status: AC
  Filled 2017-03-19: qty 5

## 2017-03-19 MED ORDER — LIDOCAINE 2% (20 MG/ML) 5 ML SYRINGE
INTRAMUSCULAR | Status: DC | PRN
  Administered 2017-03-19: 80 mg via INTRAVENOUS

## 2017-03-19 MED ORDER — LIDOCAINE 2% (20 MG/ML) 5 ML SYRINGE
INTRAMUSCULAR | Status: AC
  Filled 2017-03-19: qty 5

## 2017-03-19 MED ORDER — ONDANSETRON HCL 4 MG PO TABS: 4.0000 mg | ORAL_TABLET | Freq: Three times a day (TID) | ORAL | Status: DC | PRN

## 2017-03-19 MED ORDER — CIPROFLOXACIN HCL 500 MG PO TABS: 500.0000 mg | ORAL_TABLET | Freq: Once | ORAL | 0 refills | Status: AC

## 2017-03-19 MED ORDER — SODIUM CHLORIDE 0.9 % IV SOLN
INTRAVENOUS | Status: DC
  Administered 2017-03-19 (×2): via INTRAVENOUS

## 2017-03-19 MED ORDER — MUPIROCIN 2 % EX OINT
TOPICAL_OINTMENT | CUTANEOUS | Status: AC
  Administered 2017-03-19: 10:00:00
  Filled 2017-03-19: qty 22

## 2017-03-19 MED ORDER — HYDROMORPHONE HCL 1 MG/ML IJ SOLN
0.5000 mg | INTRAMUSCULAR | Status: DC | PRN
  Administered 2017-03-19 (×4): 0.5 mg via INTRAVENOUS
  Filled 2017-03-19 (×4): qty 0.5

## 2017-03-19 MED ORDER — CIPROFLOXACIN IN D5W 400 MG/200ML IV SOLN
INTRAVENOUS | Status: DC | PRN
  Administered 2017-03-19: 400 mg via INTRAVENOUS

## 2017-03-19 MED ORDER — SODIUM CHLORIDE 0.9 % IR SOLN
Status: DC | PRN
  Administered 2017-03-19 (×2): 3000 mL

## 2017-03-19 MED ORDER — PROPOFOL 10 MG/ML IV BOLUS
INTRAVENOUS | Status: DC | PRN
  Administered 2017-03-19: 150 mg via INTRAVENOUS

## 2017-03-19 MED ORDER — LIDOCAINE HCL 2 % EX GEL
CUTANEOUS | Status: DC | PRN
  Administered 2017-03-19: 1 via URETHRAL

## 2017-03-19 MED ORDER — ONDANSETRON HCL 4 MG/2ML IJ SOLN
INTRAMUSCULAR | Status: DC | PRN
  Administered 2017-03-19: 4 mg via INTRAVENOUS

## 2017-03-19 MED ORDER — DEXAMETHASONE SODIUM PHOSPHATE 10 MG/ML IJ SOLN
INTRAMUSCULAR | Status: AC
  Filled 2017-03-19: qty 1

## 2017-03-19 MED ORDER — DEXAMETHASONE SODIUM PHOSPHATE 10 MG/ML IJ SOLN
INTRAMUSCULAR | Status: DC | PRN
  Administered 2017-03-19: 10 mg via INTRAVENOUS

## 2017-03-19 MED ORDER — MIDAZOLAM HCL 2 MG/2ML IJ SOLN
INTRAMUSCULAR | Status: AC
Start: 2017-03-19 — End: ?
  Filled 2017-03-19: qty 2

## 2017-03-19 MED ORDER — PROPOFOL 10 MG/ML IV BOLUS
INTRAVENOUS | Status: AC
  Filled 2017-03-19: qty 20

## 2017-03-19 MED ORDER — HYDRALAZINE HCL 20 MG/ML IJ SOLN: 5.0000 mg | INTRAMUSCULAR | Status: DC | PRN

## 2017-03-19 MED ORDER — ASPIRIN-ACETAMINOPHEN-CAFFEINE 250-250-65 MG PO TABS
2.0000 | ORAL_TABLET | Freq: Every day | ORAL | Status: DC | PRN
  Filled 2017-03-19: qty 2

## 2017-03-19 MED ORDER — ONDANSETRON HCL 4 MG/2ML IJ SOLN: 4.0000 mg | Freq: Four times a day (QID) | INTRAMUSCULAR | Status: DC | PRN

## 2017-03-19 MED ORDER — ONDANSETRON HCL 4 MG/2ML IJ SOLN
4.0000 mg | INTRAMUSCULAR | Status: DC | PRN
  Administered 2017-03-19: 4 mg via INTRAVENOUS
  Filled 2017-03-19: qty 2

## 2017-03-19 MED ORDER — FENTANYL CITRATE (PF) 100 MCG/2ML IJ SOLN
INTRAMUSCULAR | Status: AC
  Filled 2017-03-19: qty 2

## 2017-03-19 MED ORDER — BELLADONNA ALKALOIDS-OPIUM 16.2-60 MG RE SUPP
RECTAL | Status: DC | PRN
  Administered 2017-03-19: 1 via RECTAL

## 2017-03-19 MED ORDER — ONDANSETRON HCL 4 MG/2ML IJ SOLN
INTRAMUSCULAR | Status: AC
  Filled 2017-03-19: qty 2

## 2017-03-19 MED ORDER — PHENAZOPYRIDINE HCL 200 MG PO TABS: 200.0000 mg | ORAL_TABLET | Freq: Three times a day (TID) | ORAL | 0 refills | Status: DC | PRN

## 2017-03-19 MED ORDER — PANTOPRAZOLE SODIUM 40 MG IV SOLR
40.0000 mg | INTRAVENOUS | Status: DC
  Administered 2017-03-19: 40 mg via INTRAVENOUS
  Filled 2017-03-19: qty 40

## 2017-03-19 MED ORDER — ACETAMINOPHEN 10 MG/ML IV SOLN
1000.0000 mg | Freq: Four times a day (QID) | INTRAVENOUS | Status: DC
  Administered 2017-03-19 (×2): 1000 mg via INTRAVENOUS
  Filled 2017-03-19 (×4): qty 100

## 2017-03-19 MED ORDER — EPHEDRINE SULFATE-NACL 50-0.9 MG/10ML-% IV SOSY
PREFILLED_SYRINGE | INTRAVENOUS | Status: DC | PRN
  Administered 2017-03-19: 5 mg via INTRAVENOUS

## 2017-03-19 SURGICAL SUPPLY — 24 items
BAG URO CATCHER STRL LF (MISCELLANEOUS) ×3 IMPLANT
BASKET DAKOTA 1.9FR 11X120 (BASKET) IMPLANT
BASKET ZERO TIP NITINOL 2.4FR (BASKET) IMPLANT
BSKT STON RTRVL ZERO TP 2.4FR (BASKET)
CATH URET 5FR 28IN OPEN ENDED (CATHETERS) ×3 IMPLANT
CLOTH BEACON ORANGE TIMEOUT ST (SAFETY) ×3 IMPLANT
COVER FOOTSWITCH UNIV (MISCELLANEOUS) IMPLANT
COVER SURGICAL LIGHT HANDLE (MISCELLANEOUS) ×3 IMPLANT
EXTRACTOR STONE NITINOL NGAGE (UROLOGICAL SUPPLIES) ×2 IMPLANT
FIBER LASER FLEXIVA 365 (UROLOGICAL SUPPLIES) ×2 IMPLANT
GLOVE BIOGEL M STRL SZ7.5 (GLOVE) ×3 IMPLANT
GOWN STRL REUS W/TWL XL LVL3 (GOWN DISPOSABLE) ×3 IMPLANT
GUIDEWIRE ANG ZIPWIRE 038X150 (WIRE) IMPLANT
GUIDEWIRE STR DUAL SENSOR (WIRE) ×3 IMPLANT
MANIFOLD NEPTUNE II (INSTRUMENTS) ×3 IMPLANT
PACK CYSTO (CUSTOM PROCEDURE TRAY) ×3 IMPLANT
SHEATH ACCESS URETERAL 24CM (SHEATH) IMPLANT
SHEATH ACCESS URETERAL 54CM (SHEATH) IMPLANT
SHEATH URETERAL 12FRX35CM (MISCELLANEOUS) IMPLANT
STENT CONTOUR 6FRX24X.038 (STENTS) ×4 IMPLANT
TUBING CONNECTING 10 (TUBING) ×2 IMPLANT
TUBING CONNECTING 10' (TUBING) ×1
TUBING UROLOGY SET (TUBING) ×3 IMPLANT
WIRE COONS/BENSON .038X145CM (WIRE) IMPLANT

## 2017-03-19 NOTE — Discharge Summary (Signed)
Date of admission: 03/18/2017  Date of discharge: 03/19/2017  Admission diagnosis: Bilateral obstructing ureteral stones, acute renal failure  Discharge diagnosis: Same  Secondary diagnoses:  Patient Active Problem List   Diagnosis Date Noted  . Nephrolithiasis 03/18/2017  . Bilateral kidney stones 03/18/2017    Procedures performed: Procedure(s): CYSTOSCOPY WITH BILATERAL RETROGRADE PYELOGRAM/BILATERAL URETEROSCOPY/ BILATERAL HOLMIUM LASER LITHOTRIPSY WITH STONE BASKET EXTRACTION/ BILATERAL URETERAL STENT PLACEMENT  History and Physical: For full details, please see admission history and physical. Briefly, April Huang is a 52 y.o. year old patient with acute renal failure and severe pain secondary to bilateral obstructing ureteral stones. the patient was admitted for 24-hour observation.   Hospital Course: The patient was transferred to [facility] long hospital from [facility] for further evaluation and management.  She was subsequently taken to the operating room the following day for treatment of her obstructing stones.  Patient tolerated the procedure well.  She was then transferred to the floor after an uneventful PACU stay.  Her hospital course was uncomplicated.  On POD#0 she had met discharge criteria: was eating a regular diet, was up and ambulating independently,  pain was well controlled, was voiding without a catheter, and was ready to for discharge.   Laboratory values:  Recent Labs    03/19/17 0209  WBC 10.6*  HGB 13.0  HCT 38.8   Recent Labs    03/18/17 1745 03/19/17 0209  NA 144 144  K 3.9 4.2  CL 107 109  CO2 22 27  GLUCOSE 118* 101*  BUN 24* 26*  CREATININE 1.60* 1.67*  CALCIUM 10.2 9.3   No results for input(s): LABPT, INR in the last 72 hours. No results for input(s): LABURIN in the last 72 hours. Results for orders placed or performed during the hospital encounter of 03/18/17  Surgical pcr screen     Status: None   Collection Time: 03/19/17  7:27 AM   Result Value Ref Range Status   MRSA, PCR NEGATIVE NEGATIVE Final   Staphylococcus aureus NEGATIVE NEGATIVE Final    Comment: (NOTE) The Xpert SA Assay (FDA approved for NASAL specimens in patients 29 years of age and older), is one component of a comprehensive surveillance program. It is not intended to diagnose infection nor to guide or monitor treatment. Performed at [facility], [place] [address], [place], [place] [address]     Disposition: Home  Discharge instruction: The patient was instructed to be ambulatory but told to refrain from heavy lifting, strenuous activity, or driving.   Discharge medications:  Allergies as of 03/19/2017      Reactions   Amoxicillin Hives   Macrodantin [nitrofurantoin Macrocrystal] Hives      Medication List    TAKE these medications   ciprofloxacin 500 MG tablet Commonly known as:  CIPRO Take 1 tablet (500 mg total) by mouth once for 1 dose.   EFFEXOR XR 75 MG 24 hr capsule Generic drug:  venlafaxine XR Take 225 mg by mouth every evening.   EXCEDRIN EXTRA STRENGTH 250-250-65 MG tablet Generic drug:  aspirin-acetaminophen-caffeine Take 2 tablets by mouth daily as needed for headache.   fluticasone 50 MCG/ACT nasal spray Commonly known as:  FLONASE Place 2 sprays into both nostrils daily as needed for allergies or rhinitis.   hydrochlorothiazide 25 MG tablet Commonly known as:  HYDRODIURIL Take 25 mg by mouth daily.   oxyCODONE-acetaminophen 5-325 MG tablet Commonly known as:  PERCOCET/ROXICET Take 1-2 tablets by mouth every 4 (four) hours as needed  for severe pain.   oxymetazoline 0.05 % nasal spray Commonly known as:  AFRIN Place 2 sprays into both nostrils daily as needed for congestion.   phenazopyridine 200 MG tablet Commonly known as:  PYRIDIUM Take 1 tablet (200 mg total) by mouth 3 (three) times daily as needed for pain.   pseudoephedrine-guaifenesin 60-600 MG 12 hr tablet Commonly known as:   MUCINEX D Take 1 tablet by mouth every 12 (twelve) hours as needed for congestion.   THERMACARE BACK/HIP Misc Apply 1 patch topically daily as needed (for pain).       Followup:  Follow-up Information    Kathie Rhodes, MD In 6 weeks.   Specialty:  Urology Why:  for renal ultrasound and discussion of stone prevention Contact information: [place]  [id number] [phone]

## 2017-03-19 NOTE — H&P (Signed)
Cc: bilateral obstructing stones  History of present illness: 52 year old female with a history of kidney stone presented to [facility] yesterday afternoon with bilateral acute onset flank pain.  Workup demonstrated bilateral obstructing ureteral stones with elevated creatinine.  There is no evidence of infection.  The patient was given IV pain medication and hydration.  Her pain was relatively well controlled.  I was then consulted for further management.  She was [facility] for further evaluation and management including plans for bilateral ureteral stent placement.  Currently the patient is resting comfortably with persistent pain but tolerable.  She is not having any associated nausea or vomiting.  She denies any dysuria or gross hematuria.  The patient is otherwise quite healthy with no significant comorbidities.  Review of systems: A 12 point comprehensive review of systems was obtained and is negative unless otherwise stated in the history of present illness.  Patient Active Problem List   Diagnosis Date Noted  . Nephrolithiasis 03/18/2017  . Bilateral kidney stones 03/18/2017    No current facility-administered medications on file prior to encounter.    Current Outpatient Medications on File Prior to Encounter  Medication Sig Dispense Refill  . aspirin-acetaminophen-caffeine (EXCEDRIN EXTRA STRENGTH) 250-250-65 MG tablet Take 2 tablets by mouth daily as needed for headache.    . EFFEXOR XR 75 MG 24 hr capsule Take 225 mg by mouth every evening.     . fluticasone (FLONASE) 50 MCG/ACT nasal spray Place 2 sprays into both nostrils daily as needed for allergies or rhinitis.    Marland Kitchen. Heat Wraps (THERMACARE BACK/HIP) MISC Apply 1 patch topically daily as needed (for pain).    . hydrochlorothiazide (HYDRODIURIL) 25 MG tablet Take 25 mg by mouth daily.     Marland Kitchen. oxyCODONE-acetaminophen (PERCOCET/ROXICET) 5-325 MG tablet Take 1-2 tablets by mouth every  4 (four) hours as needed for severe pain. 20 tablet 0  . oxymetazoline (AFRIN) 0.05 % nasal spray Place 2 sprays into both nostrils daily as needed for congestion.    . pseudoephedrine-guaifenesin (MUCINEX D) 60-600 MG 12 hr tablet Take 1 tablet by mouth every 12 (twelve) hours as needed for congestion.      Past Medical History:  Diagnosis Date  . Anxiety   . Complication of anesthesia    difficulting waking up  . Fibromyalgia   . Renal disorder    kidney stone    Past Surgical History:  Procedure Laterality Date  . BREAST SURGERY     reduction  . CHOLECYSTECTOMY    . DILATION AND CURETTAGE OF UTERUS  2006  . LITHOTRIPSY  [id number]  . Needle biopsy of breast  03-2011  . WISDOM TOOTH EXTRACTION  1989    Social History   Tobacco Use  . Smoking status: Current Every Day Smoker    Packs/day: 1.00    Types: Cigarettes  . Smokeless tobacco: Never Used  Substance Use Topics  . Alcohol use: Yes  . Drug use: No    No family history on file.  PE: Vitals:   03/18/17 2300 03/19/17 0019 03/19/17 0149 03/19/17 0435  BP: 121/84 117/79 (!) 141/81 122/74  Pulse: 80 83 78 78  Resp:  18 18 20   Temp:   97.7 F (36.5 C) 97.7 F (36.5 C)  TempSrc:   Oral Oral  SpO2: 91% 94% 98% 95%  Weight:   79.2 kg (174 lb 9.6 oz) 79.2 kg (174 lb 9.6 oz)  Height:   5\' 5"  (1.651 m)  Patient appears to be in no acute distress  patient is alert and oriented x3 Atraumatic normocephalic head No cervical or supraclavicular lymphadenopathy appreciated No increased work of breathing, no audible wheezes/rhonchi Regular sinus rhythm/rate Abdomen is soft, nontender, nondistended, bilateral CVA tenderness Lower extremities are symmetric without appreciable edema Grossly neurologically intact No identifiable skin lesions  Recent Labs    03/19/17 0209  WBC 10.6*  HGB 13.0  HCT 38.8   Recent Labs    03/18/17 1745 03/19/17 0209  NA 144 144  K 3.9 4.2  CL 107 109  CO2 22 27  GLUCOSE 118*  101*  BUN 24* 26*  CREATININE 1.60* 1.67*  CALCIUM 10.2 9.3   No results for input(s): LABPT, INR in the last 72 hours. No results for input(s): LABURIN in the last 72 hours. No results found for this or any previous visit.  Imaging: I have independently reviewed the patient's CT scan, and stone protocol which demonstrated an 11 mm left mid ureteral stone and a 9 mm right mid ureteral stone.  She has associated bilateral proximal hydroureteronephrosis.  Imp: 52 year old female with bilateral obstructing stones and acute obstructive renal failure.  Currently, the patient's pain is relatively well controlled.  Recommendations: Plan to take the patient to the operating room on an urgent basis to place a stent.  We also discussed the possibility of performing bilateral ureteroscopy, laser lithotripsy and stone fragmentation.  The patient has quite a bit of experience with stones, she understands the procedure quite well.  She understands following the operation she will have bilateral stents.  I have discussed all the risks and the benefits with her and answered all her questions.  We will plan to proceed once the OR becomes available early this morning.  Berniece Salines W

## 2017-03-19 NOTE — Anesthesia Procedure Notes (Signed)
Procedure Name: LMA Insertion Date/Time: 03/19/2017 12:24 PM Performed by: Epimenio SarinJarvela, Arron Tetrault R, CRNA Pre-anesthesia Checklist: Patient identified, Emergency Drugs available, Suction available, Patient being monitored and Timeout performed Patient Re-evaluated:Patient Re-evaluated prior to induction Oxygen Delivery Method: Circle system utilized Preoxygenation: Pre-oxygenation with 100% oxygen Induction Type: IV induction LMA: LMA with gastric port inserted LMA Size: 3.0 Number of attempts: 1 Dental Injury: Teeth and Oropharynx as per pre-operative assessment

## 2017-03-19 NOTE — Transfer of Care (Signed)
Immediate Anesthesia Transfer of Care Note  Patient: Ronnette JuniperDenise S Baune  Procedure(s) Performed: CYSTOSCOPY WITH BILATERAL RETROGRADE PYELOGRAM/BILATERAL URETEROSCOPY/ BILATERAL HOLMIUM LASER LITHOTRIPSY WITH STONE BASKET EXTRACTION/ BILATERAL URETERAL STENT PLACEMENT (Bilateral Ureter)  Patient Location: PACU  Anesthesia Type:General  Level of Consciousness: drowsy and patient cooperative  Airway & Oxygen Therapy: Patient Spontanous Breathing and Patient connected to face mask oxygen  Post-op Assessment: Report given to RN and Post -op Vital signs reviewed and stable  Post vital signs: Reviewed and stable  Last Vitals:  Vitals:   03/19/17 0945 03/19/17 1045  BP: 126/82 107/69  Pulse: 75 70  Resp: 16   Temp: 36.6 C   SpO2: 99% 92%    Last Pain:  Vitals:   03/19/17 1032  TempSrc:   PainSc: 4       Patients Stated Pain Goal: 3 (03/19/17 1002)  Complications: No apparent anesthesia complications

## 2017-03-19 NOTE — Discharge Instructions (Signed)
DISCHARGE INSTRUCTIONS FOR KIDNEY STONE/URETERAL STENT   MEDICATIONS:  1.  Resume all your other meds from home 2. Pyridium is to help with the burning/stinging when you urinate. 3.  Take oxycodone already on hand to treat severe pain. 4. Take Cipro one hour prior to removal of your stent.   ACTIVITY:  1. No strenuous activity x 1week  2. No driving while on narcotic pain medications  3. Drink plenty of water  4. Continue to walk at home - you can still get blood clots when you are at home, so keep active, but don't over do it.  5. May return to work/school tomorrow or when you feel ready   BATHING:  1. You can shower and we recommend daily showers  2. You have a string coming from your urethra: The stent string is attached to your ureteral stent. Do not pull on this.   SIGNS/SYMPTOMS TO CALL:  Please call us if you have a fever greater than 101.5, uncontrolled nausea/vomiting, uncontrolled pain, dizziness, unable to urinate, bloody urine, chest pain, shortness of breath, leg swelling, leg pain, redness around wound, drainage from wound, or any other concerns or questions.   You can reach us at [phone].   FOLLOW-UP:  1. You have an appointment in 6 weeks with a ultrasound of your kidneys prior.   2. You have a string attached to your stents, you may remove it on Thursday March 14th. To do this, pull the strings until the stents are completely removed. You may feel an odd sensation in your back.

## 2017-03-19 NOTE — Op Note (Signed)
Preoperative diagnosis:  1. Bilateral obstructing ureteral stones  Postoperative diagnosis:  1. Same  Procedure: 1. Cystoscopy, bilateral retrograde pyelograms with interpretation 2. Bilateral ureteroscopy, laser lithotripsy, stone basketing removal, ureteral stent placement  Surgeon: Crist Fat, MD  Anesthesia: General  Complications: None  Intraoperative findings:  #1: Right retrograde pyelogram was performed demonstrating a normal caliber distal ureter with a filling defect in the mid to distal portion of the ureter consistent with the patient's known 9 mm stone.  There are no additional filling defects.  A completion retrograde pyelogram at the end once the stone was removed was performed which demonstrated a normal caliber ureter no additional filling defects.  #2: The left retrograde pyelogram was performed demonstrating a normal caliber distal ureter with a more proximal large filling defect consistent with the patient's known stone.  #3: Completion retrograde pyelogram was performed on the patient's left-hand side demonstrating a narrow caliber ureter previous site of the impacted stone but no additional filling defects.  #4: 24 cm x6 French double-J stents were placed in both ureters.  EBL: Minimal  Specimens: Stone fragments were taken to [facility] for stone composition analysis  Indication: April Huang is a 52 y.o. patient with bilateral obstructing stones and acute renal failure.  After reviewing the management options for treatment, he elected to proceed with the above surgical procedure(s). We have discussed the potential benefits and risks of the procedure, side effects of the proposed treatment, the likelihood of the patient achieving the goals of the procedure, and any potential problems that might occur during the procedure or recuperation. Informed consent has been obtained.  Description of procedure:  The patient was taken to the operating room  and general anesthesia was induced.  The patient was placed in the dorsal lithotomy position, prepped and draped in the usual sterile fashion, and preoperative antibiotics were administered. A preoperative time-out was performed.   A 21 French 30 degree cystoscope was gently passed through the patient's urethra and into the bladder under visual guidance.  A 5 French open-ended ureteral catheter was then used to perform retrograde pyelogram on the right with the above findings.  I then advanced a 0.38 sensor wire through the open-ended catheter and was able to manipulate the wire beyond the stone into the right renal pelvis.  I then used a 4/6 French semirigid ureteroscope and advanced it through the bladder and into the right ureter.  I encountered the stone right past the pelvic inlet of the ureter.  I was able to fragment the stone using a 365 nm laser fiber with settings of 25 Hz and point to 8 J.  The stone was dusted and fragmented into smaller pieces.  Then using a 0 tip basket the stones were removed one by one.  Additional dusted stone fragments were left in the ureter to pass on their own.  I then it turned my attention to the patient's left side and using a 5 [place] open-ended ureteral catheter performed retrograde pyelogram with the above findings.  I then advanced a 0.38 sensor wire through the open-ended catheter and into the proximal left ureter.  I was unable to advance this wire beyond the stone initially.  The wire did curl around the stone but I was unable to get it beyond this area.  I then opted at this point to use the 4/6 French semirigid ureteroscope was able to advance it through the urethra and into the left ureter under visual guidance.  I did  encounter the stone in the  left proximal ureter.  Using a basket I was able to pull off a small piece of the stone and subsequently was able to unwrap the wire around the stone and advanced this into the left renal pelvis under fluoroscopic  guidance.  I removed the basketed stone atraumatically.  Next I opted to dust the stone as performed on the right side using there are 365 nm laser fiber with the same settings as used on the right.  The stone was easily fragmented.  The ureteral wall did appear edematous as if the stone had been impacted.  Several of the larger fragments were basketed and removed.  There was some dusting fragments that remained that I was unable to grasp with the basket.  Next I exchange the wire in the left ureter for the 5 [place] open-ended catheter and performed a retrograde pyelogram (completion) on the left side with the above findings.  I then replaced the wire and passed a 6 [place] times 24 cm double-J stent over the wire and into the upper pole of the left renal pelvis under fluoroscopic guidance.  Once the stent was noted to be well positioned in the left upper pole I advanced the stent to the urethral meatus and using my finger gently remove the wire keeping the stent at the meatus.  Once the wire had been completely removed it popped nicely into the patient's bladder as confirmed with fluoroscopy.  I then turned my attention to the patient's right and exchange the sensor wire for a 5 [place] open-ended ureteral catheter.  I then performed a completion retrograde pyelogram on the right with the above findings.  I then repassed the sensor wire and exchanged for the open-ended catheter.  I then advanced a 24 cm x6 [place] double-J ureteral stent over the wire and under fluoroscopic guidance positioned into the middle of the renal pelvis.  Once the stent was noted to be up in the renal pelvis it was advanced to the urethral meatus and with gentle back pressure I was able to keep the stent at the urethral meatus while simultaneously removing the wire.  I then used the beak of the scope to advance the stent into the patient's bladder, drained the bladder, and remove all the stone fragments.  Urethral lidocaine jelly was  then injected into the patient's urethra.  A B&O suppository was placed in the rectum.  The stent tethers were brought through the urethral meatus and tucked into the patient's vagina.  The patient was subsequently extubated and returned the PACU in stable condition.  Crist FatBenjamin W. Oriana Horiuchi, M.D.

## 2017-03-19 NOTE — Anesthesia Preprocedure Evaluation (Addendum)
Anesthesia Evaluation  Patient identified by MRN, date of birth, ID band Patient awake    Reviewed: Allergy & Precautions, H&P , Patient's Chart, lab work & pertinent test results, reviewed documented beta blocker date and time   Airway Mallampati: II  TM Distance: >3 FB Neck ROM: full    Dental no notable dental hx.    Pulmonary Current Smoker,    Pulmonary exam normal breath sounds clear to auscultation       Cardiovascular  Rhythm:regular Rate:Normal     Neuro/Psych    GI/Hepatic   Endo/Other    Renal/GU      Musculoskeletal   Abdominal   Peds  Hematology   Anesthesia Other Findings   Reproductive/Obstetrics                             Anesthesia Physical Anesthesia Plan  ASA: II  Anesthesia Plan: General   Post-op Pain Management:    Induction: Intravenous  PONV Risk Score and Plan: 2 and TIVA, Ondansetron, Dexamethasone, Treatment may vary due to age or medical condition and Propofol infusion  Airway Management Planned: LMA  Additional Equipment:   Intra-op Plan:   Post-operative Plan:   Informed Consent: I have reviewed the patients History and Physical, chart, labs and discussed the procedure including the risks, benefits and alternatives for the proposed anesthesia with the patient or authorized representative who has indicated his/her understanding and acceptance.   Dental Advisory Given  Plan Discussed with: CRNA and Surgeon  Anesthesia Plan Comments: ( )       Anesthesia Quick Evaluation

## 2017-03-19 NOTE — ED Notes (Signed)
[place] to [place], [place] [facility]

## 2017-03-20 ENCOUNTER — Encounter (HOSPITAL_COMMUNITY): Payer: Self-pay | Admitting: Urology

## 2017-03-20 NOTE — Anesthesia Postprocedure Evaluation (Signed)
Anesthesia Post Note  Patient: April Huang  Procedure(s) Performed: CYSTOSCOPY WITH BILATERAL RETROGRADE PYELOGRAM/BILATERAL URETEROSCOPY/ BILATERAL HOLMIUM LASER LITHOTRIPSY WITH STONE BASKET EXTRACTION/ BILATERAL URETERAL STENT PLACEMENT (Bilateral Ureter)     Patient location during evaluation: PACU Anesthesia Type: General Level of consciousness: awake and alert Pain management: pain level controlled Vital Signs Assessment: post-procedure vital signs reviewed and stable Respiratory status: spontaneous breathing, nonlabored ventilation, respiratory function stable and patient connected to nasal cannula oxygen Cardiovascular status: blood pressure returned to baseline and stable Postop Assessment: no apparent nausea or vomiting Anesthetic complications: no    Last Vitals:  Vitals:   03/19/17 1415 03/19/17 1447  BP: 132/84 132/79  Pulse: 84 83  Resp: 17 18  Temp: 36.7 C 36.9 C  SpO2: 94% 96%    Last Pain:  Vitals:   03/19/17 1447  TempSrc: Oral  PainSc:                  April Huang,April Huang

## 2018-11-15 ENCOUNTER — Other Ambulatory Visit: Payer: Self-pay

## 2018-11-15 DIAGNOSIS — Z20822 Contact with and (suspected) exposure to covid-19: Secondary | ICD-10-CM

## 2018-11-16 LAB — NOVEL CORONAVIRUS, NAA: SARS-CoV-2, NAA: NOT DETECTED

## 2018-12-28 ENCOUNTER — Other Ambulatory Visit: Payer: Self-pay

## 2018-12-28 ENCOUNTER — Ambulatory Visit: Payer: 59 | Attending: Internal Medicine

## 2018-12-28 DIAGNOSIS — Z20822 Contact with and (suspected) exposure to covid-19: Secondary | ICD-10-CM

## 2018-12-30 LAB — NOVEL CORONAVIRUS, NAA: SARS-CoV-2, NAA: NOT DETECTED

## 2019-04-05 ENCOUNTER — Ambulatory Visit: Payer: 59 | Attending: Internal Medicine

## 2019-04-05 DIAGNOSIS — Z23 Encounter for immunization: Secondary | ICD-10-CM

## 2019-04-05 NOTE — Progress Notes (Signed)
   Covid-19 Vaccination Clinic  Name:  April Huang    MRN: [id number] DOB: Apr 23, 1965  04/05/2019  April Huang was observed post Covid-19 immunization for 30 minutes based on pre-vaccination screening without incident. She was provided with Vaccine Information Sheet and instruction to access the V-Safe system.   April Huang was instructed to call 911 with any severe reactions post vaccine: Marland Kitchen Difficulty breathing  . Swelling of face and throat  . A fast heartbeat  . A bad rash all over body  . Dizziness and weakness   Immunizations Administered    Name Date Dose VIS Date Route   Moderna COVID-19 Vaccine 04/05/2019  9:16 AM 0.5 mL 12/12/2018 Intramuscular   Manufacturer: Moderna   Lot: [id number]   NDC: 85462-703-50

## 2019-05-08 ENCOUNTER — Ambulatory Visit: Payer: 59 | Attending: Internal Medicine

## 2019-05-08 DIAGNOSIS — Z23 Encounter for immunization: Secondary | ICD-10-CM

## 2019-05-08 NOTE — Progress Notes (Signed)
   Covid-19 Vaccination Clinic  Name:  April Huang    MRN: [id number] DOB: [birth year]  05/08/2019  April Huang was observed post Covid-19 immunization for 15 minutes without incident. She was provided with Vaccine Information Sheet and instruction to access the V-Safe system.   April Huang was instructed to call 911 with any severe reactions post vaccine: Marland Kitchen Difficulty breathing  . Swelling of face and throat  . A fast heartbeat  . A bad rash all over body  . Dizziness and weakness   Immunizations Administered    Name Date Dose VIS Date Route   Moderna COVID-19 Vaccine 05/08/2019  8:55 AM 0.5 mL 12/2018 Intramuscular   Manufacturer: Moderna   Lot: [id number]   NDC: 70350-093-81

## 2019-06-15 IMAGING — CT CT RENAL STONE PROTOCOL
2 of 4 series · 16 of 46 positions shown, 18 images · non-contrast
Comparison: 09/26/2011 CT abdomen and pelvis

CLINICAL DATA: 51 y/o F; sharp intermittent flank pain starting
last night. Nausea without vomiting.

EXAM:
CT ABDOMEN AND PELVIS WITHOUT CONTRAST
TECHNIQUE: Multidetector CT imaging of the abdomen and pelvis was performed
following the standard protocol without IV contrast.

[Series 2: axial st · axial · 0.82mm/px · z∈[+1140,+1545]mm · 13 of 89 slices shown, 15 images]
[im 4/89  soft-tissue]
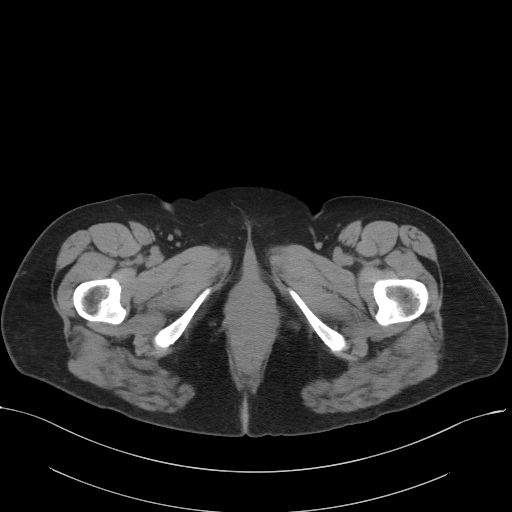
[im 4/89  bone]
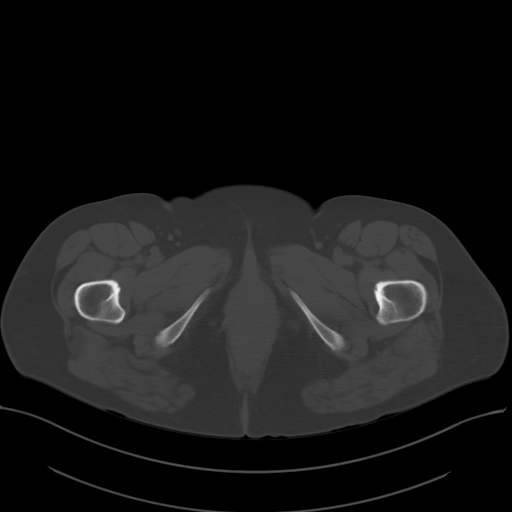
[im 11/89  soft-tissue]
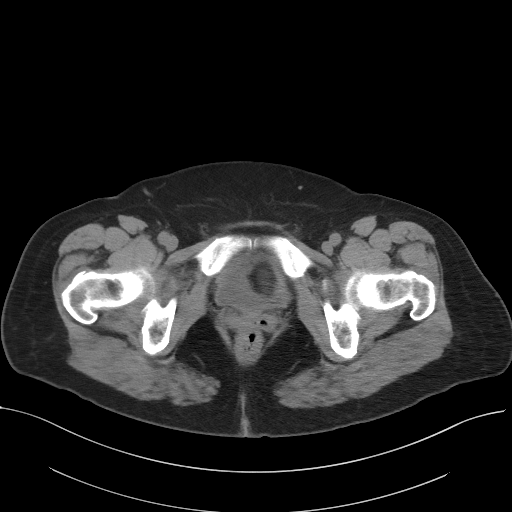
[im 18/89  soft-tissue]
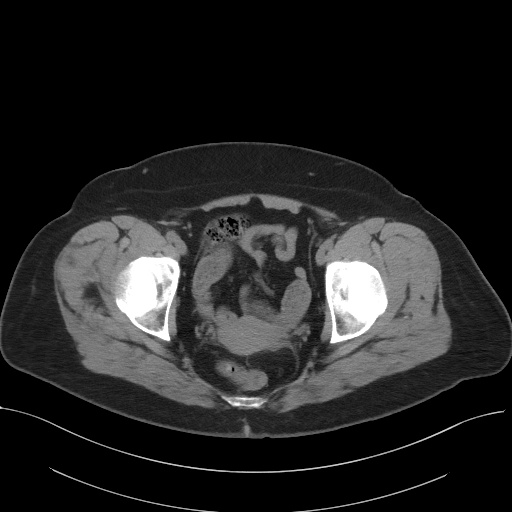
[im 25/89  soft-tissue]
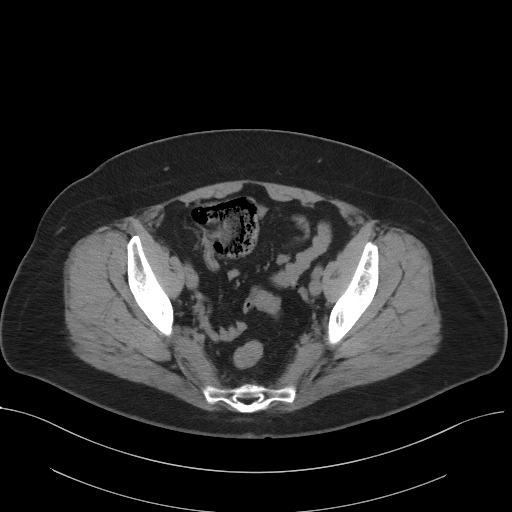
[im 32/89  soft-tissue]
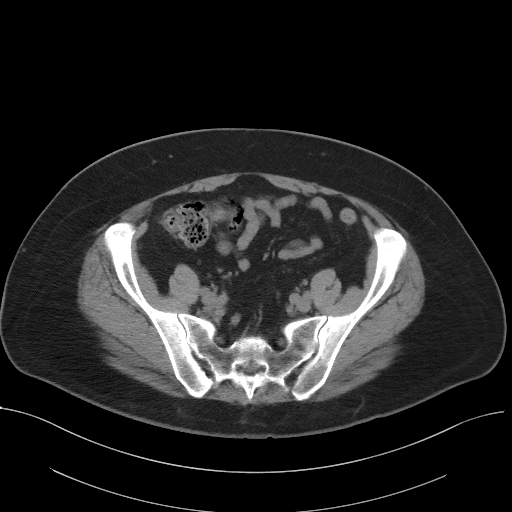
[im 39/89  soft-tissue]
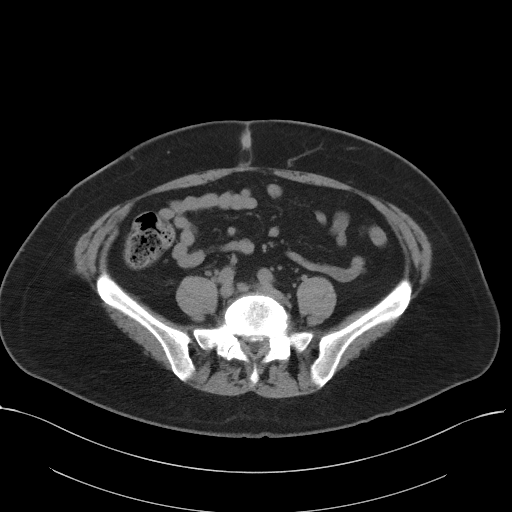
[im 46/89  soft-tissue]
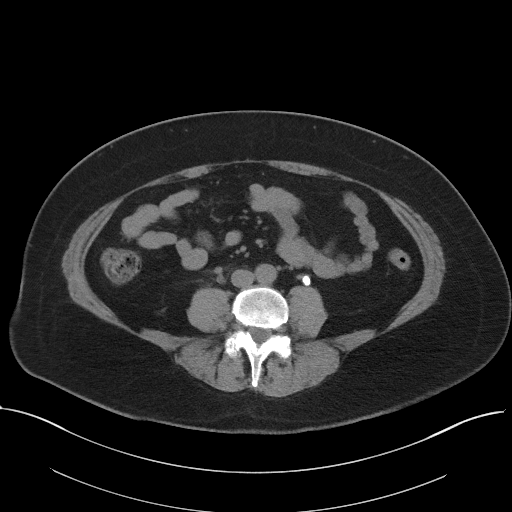
[im 50/89  soft-tissue]
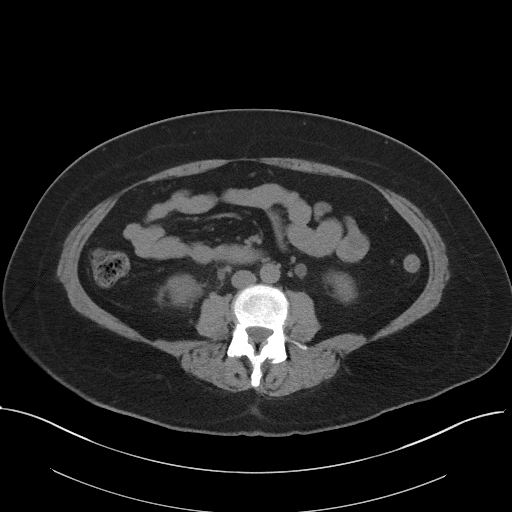
[im 57/89  soft-tissue]
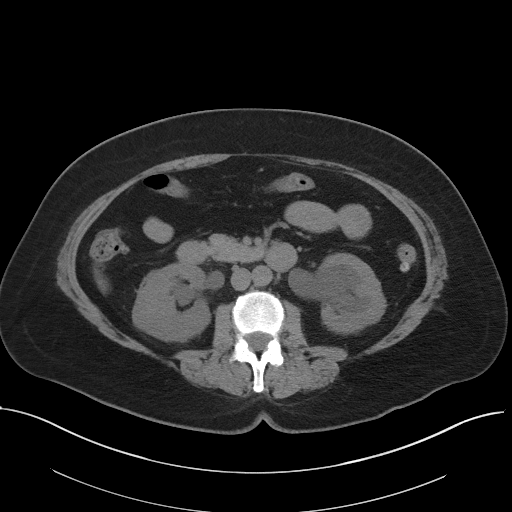
[im 57/89  bone]
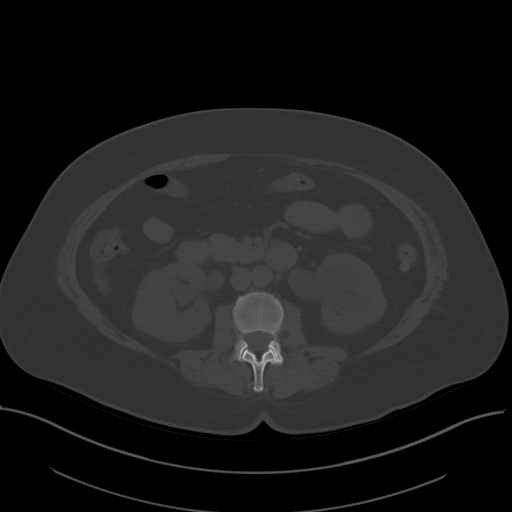
[im 64/89  soft-tissue]
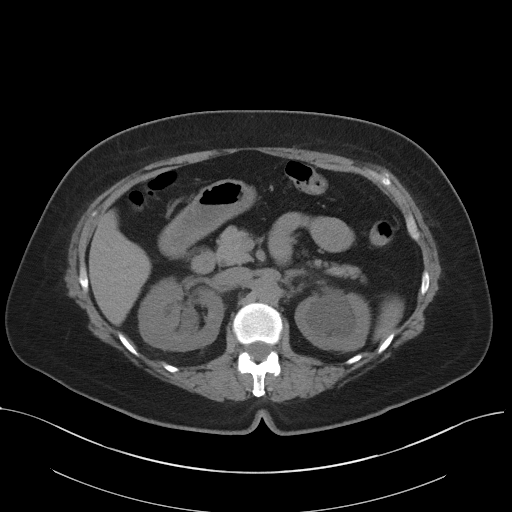
[im 71/89  soft-tissue]
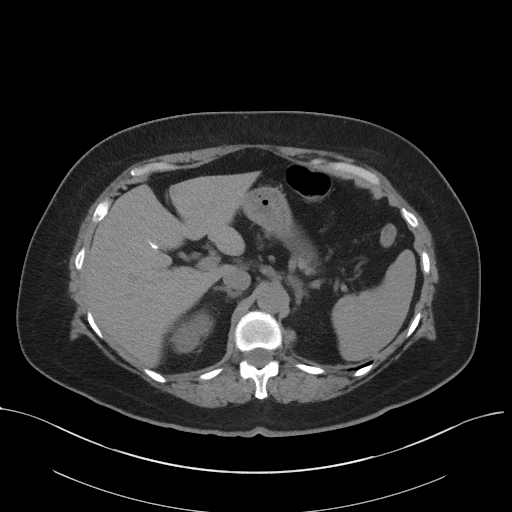
[im 78/89  soft-tissue]
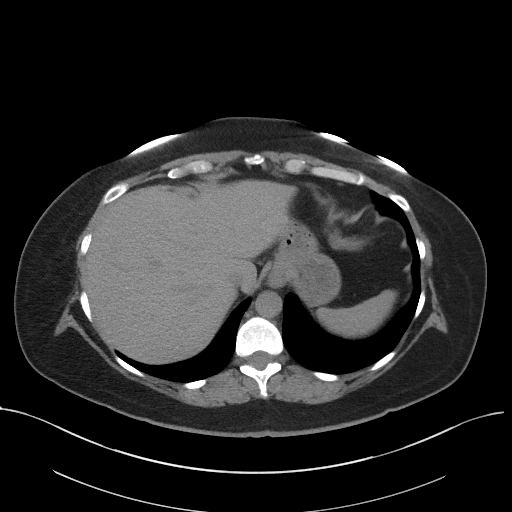
[im 85/89  soft-tissue]
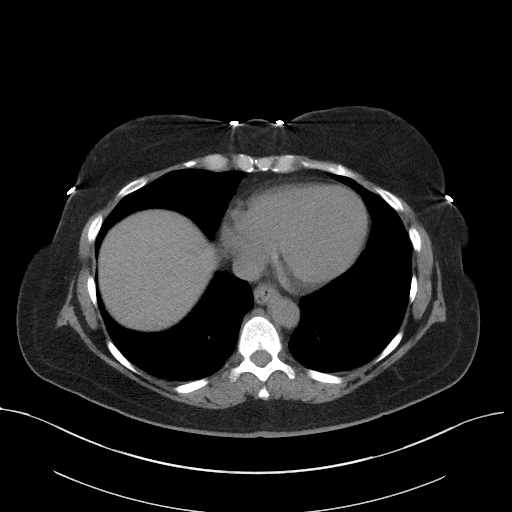

[Series 5: coronal st · coronal · 0.74mm/px · 3 of 101 slices shown]
[im 34/101  soft-tissue]
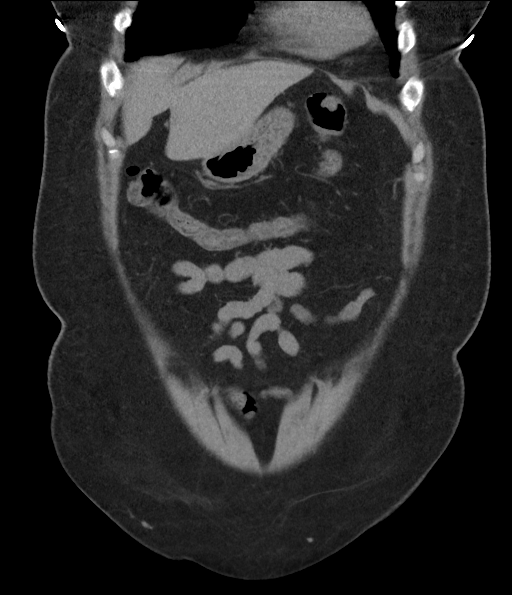
[im 45/101  soft-tissue]
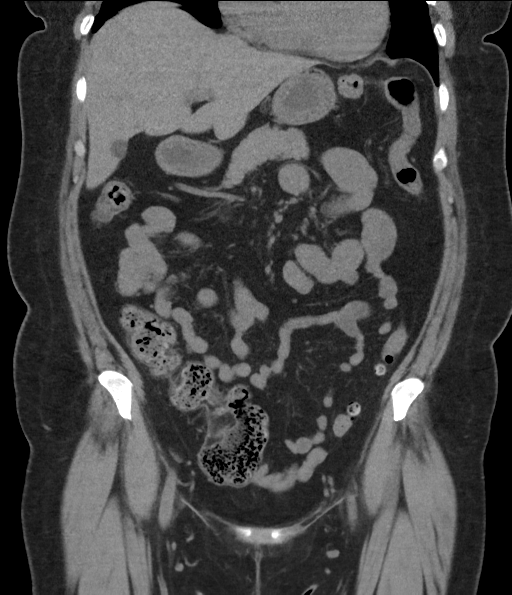
[im 56/101  soft-tissue]
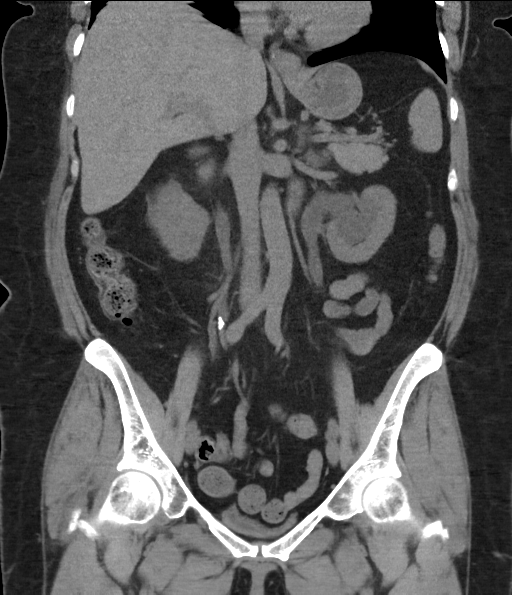

[16 of 46 positions shown; findings below may reference images not displayed]

FINDINGS: Lower chest: No acute abnormality.

Hepatobiliary: No focal liver abnormality is seen. Status post
cholecystectomy. No biliary dilatation.

Pancreas: Unremarkable. No pancreatic ductal dilatation or
surrounding inflammatory changes.

Spleen: Normal in size without focal abnormality.

Adrenals/Urinary Tract: Stable 12 mm left adrenal nodule measuring
-6 HU compatible with adenoma.

12 mm nephrolith within left mid ureter at L4 level (series 5, image
59) with moderate to severe proximal hydronephrosis. Punctate
non-obstructing left interpolar kidney stone.

10 mm nephroliths within the right mid ureter at the L5 level with
moderate proximal hydronephrosis. Multiple punctate nonobstructing
stones in the right kidney.

No obstructing stone is identified within the downstream ureters
bilaterally or the urinary bladder. Midline punctate density at
bladder neck/urethra may represent a passing stone (series 2, image
85).

Stomach/Bowel: Stomach is within normal limits. Appendix appears
normal. No evidence of bowel wall thickening, distention, or
inflammatory changes. Scattered sigmoid diverticulosis.

Vascular/Lymphatic: No significant vascular findings are present. No
enlarged abdominal or pelvic lymph nodes.

Reproductive: Uterus and bilateral adnexa are unremarkable.

Other: No abdominal wall hernia or abnormality. No abdominopelvic
ascites.

Musculoskeletal: No fracture is seen.
IMPRESSION: 1. Moderate to severe left proximal hydronephrosis with 12 mm stone
in the left mid ureter at L4 level.
2. Moderate right hydronephrosis with 10 mm stone in right mid
ureter at L5 level.
3. Punctate calcification at bladder neck/urethra may represent
passing stone.
4. Bilateral nonobstructive nephrolithiasis.
5. Scattered sigmoid diverticulosis.

By: Orn-Uma Marnburg M.D.

## 2020-05-26 ENCOUNTER — Encounter (INDEPENDENT_AMBULATORY_CARE_PROVIDER_SITE_OTHER): Payer: Self-pay | Admitting: Internal Medicine

## 2020-05-26 ENCOUNTER — Other Ambulatory Visit: Payer: Self-pay

## 2020-05-26 ENCOUNTER — Ambulatory Visit (INDEPENDENT_AMBULATORY_CARE_PROVIDER_SITE_OTHER): Payer: 59 | Admitting: Internal Medicine

## 2020-05-26 VITALS — BP 133/80 | HR 93 | Temp 97.3°F | Resp 18 | Ht 64.0 in | Wt 175.0 lb

## 2020-05-26 DIAGNOSIS — R5381 Other malaise: Secondary | ICD-10-CM

## 2020-05-26 DIAGNOSIS — E559 Vitamin D deficiency, unspecified: Secondary | ICD-10-CM | POA: Diagnosis not present

## 2020-05-26 DIAGNOSIS — E2839 Other primary ovarian failure: Secondary | ICD-10-CM

## 2020-05-26 DIAGNOSIS — Z13220 Encounter for screening for lipoid disorders: Secondary | ICD-10-CM

## 2020-05-26 DIAGNOSIS — Z131 Encounter for screening for diabetes mellitus: Secondary | ICD-10-CM

## 2020-05-26 DIAGNOSIS — R5383 Other fatigue: Secondary | ICD-10-CM

## 2020-05-26 NOTE — Progress Notes (Signed)
Metrics: Intervention Frequency ACO  Documented Smoking Status Yearly  Screened one or more times in 24 months  Cessation Counseling or  Active cessation medication Past 24 months  Past 24 months   Guideline developer: UpToDate (See UpToDate for funding source) Date Released: 2014       Wellness Office Visit  Subjective:  Patient ID: April Huang, female    DOB: [birth year]  Age: 55 y.o. MRN: [id number]  CC: This pleasant 55 year old lady comes to our practice as a new patient to establish care.  She has not seen a physician for 7 years. HPI Her main concern is that she feels tired all the time and she is tired of being tired.  She would like to get healthier.  She has no motivation.  Her focus and concentration is lacking. She also is postmenopausal for the last 5 years or so.  Past Medical History:  Diagnosis Date  . Anxiety   . Complication of anesthesia    difficulting waking up  . Fibromyalgia   . Renal disorder    kidney stone   Past Surgical History:  Procedure Laterality Date  . BREAST SURGERY Bilateral 2005   reduction  . CHOLECYSTECTOMY    . CYSTOSCOPY W/ URETERAL STENT PLACEMENT Bilateral 03/19/2017   Procedure: CYSTOSCOPY WITH BILATERAL RETROGRADE PYELOGRAM/BILATERAL URETEROSCOPY/ BILATERAL HOLMIUM LASER LITHOTRIPSY WITH STONE BASKET EXTRACTION/ BILATERAL URETERAL STENT PLACEMENT;  Surgeon: Crist Fat, MD;  Location: [facility];  Service: Urology;  Laterality: Bilateral;  . DILATION AND CURETTAGE OF UTERUS  2006  . LITHOTRIPSY  [phone]  . Needle biopsy of breast  03-2011  . WISDOM TOOTH EXTRACTION  1989     Family History  Problem Relation Age of Onset  . Thyroid disease Mother   . Heart disease Father   . Obesity Daughter     Social History   Social History Narrative   Divorced since 2007,married for 15 years.Single ,daughter lives with her.Works as Occupational hygienist for [employer].   Social History   Tobacco Use  . Smoking status: Current Every Day  Smoker    Packs/day: 0.50    Types: Cigarettes  . Smokeless tobacco: Never Used  Substance Use Topics  . Alcohol use: Yes    Comment: occ    Current Meds  Medication Sig  . EFFEXOR XR 75 MG 24 hr capsule Take 225 mg by mouth every evening.   . fluticasone (FLONASE) 50 MCG/ACT nasal spray Place 2 sprays into both nostrils daily as needed for allergies or rhinitis.       Objective:   Today's Vitals: BP 133/80 (BP Location: Left Arm, Patient Position: Sitting, Cuff Size: Normal)   Pulse 93   Temp (!) 97.3 F (36.3 C) (Temporal)   Resp 18   Ht 5\' 4"  (1.626 m)   Wt 175 lb (79.4 kg)   LMP 05/27/2015   SpO2 99%   Breastfeeding Unknown   BMI 30.04 kg/m  Vitals with BMI 05/26/2020 03/19/2017 03/19/2017  Height 5\' 4"  - -  Weight 175 lbs - -  BMI 30.02 - -  Systolic 133 132 05/19/2017  Diastolic 80 79 84  Pulse 93 83 84     Physical Exam  She is obese.  Blood pressure acceptable for the first visit.  Alert and orientated without any focal neuro signs.     Assessment   1. Malaise and fatigue   2. Vitamin D deficiency disease   3. Primary ovarian failure   4. Screening for diabetes  mellitus   5. Screening for lipoid disorders       Tests ordered Orders Placed This Encounter  Procedures  . CBC  . COMPLETE METABOLIC PANEL WITH GFR  . Hemoglobin A1c  . Lipid panel  . T3, free  . T4, free  . TSH  . VITAMIN D 25 Hydroxy (Vit-D Deficiency, Fractures)  . Testos,Total,Free and SHBG (Female)  . DHEA-sulfate  . Estradiol  . Progesterone     Plan: 1. Blood work is ordered. 2. Explained the philosophy of practice. 3. I will see her in a few weeks to discuss all results and further recommendations.  I spent about 30 minutes with this visit, listening to her symptoms and formulating a plan.   No orders of the defined types were placed in this encounter.   Wilson Singer, MD

## 2020-05-27 LAB — COMPLETE METABOLIC PANEL WITH GFR
AST: 14 U/L (ref 10–35)
Creat: 0.79 mg/dL (ref 0.50–1.05)
GFR, Est African American: 98 mL/min/{1.73_m2} (ref >=60)
Globulin: 2.3 g/dL (calc) (ref 1.9–3.7)

## 2020-05-27 LAB — T4, FREE: Free T4: 1.1 ng/dL (ref 0.8–1.8)

## 2020-06-01 LAB — CBC
HCT: 44.6 % (ref 35.0–45.0)
Hemoglobin: 15.2 g/dL (ref 11.7–15.5)
MCH: 30.3 pg (ref 27.0–33.0)
MCHC: 34.1 g/dL (ref 32.0–36.0)
MCV: 88.8 fL (ref 80.0–100.0)
MPV: 10.6 fL (ref 7.5–12.5)
Platelets: 336 10*3/uL (ref 140–400)
RBC: 5.02 10*6/uL (ref 3.80–5.10)
RDW: 12.9 % (ref 11.0–15.0)
WBC: 9 10*3/uL (ref 3.8–10.8)

## 2020-06-01 LAB — COMPLETE METABOLIC PANEL WITH GFR
AG Ratio: 2 (calc) (ref 1.0–2.5)
ALT: 14 U/L (ref 6–29)
Albumin: 4.5 g/dL (ref 3.6–5.1)
Alkaline phosphatase (APISO): 85 U/L (ref 37–153)
BUN: 17 mg/dL (ref 7–25)
CO2: 24 mmol/L (ref 20–32)
Calcium: 9.6 mg/dL (ref 8.6–10.4)
Chloride: 110 mmol/L (ref 98–110)
GFR, Est Non African American: 84 mL/min/{1.73_m2} (ref >=60)
Glucose, Bld: 99 mg/dL (ref 65–139)
Potassium: 4 mmol/L (ref 3.5–5.3)
Sodium: 141 mmol/L (ref 135–146)
Total Bilirubin: 0.3 mg/dL (ref 0.2–1.2)
Total Protein: 6.8 g/dL (ref 6.1–8.1)

## 2020-06-01 LAB — VITAMIN D 25 HYDROXY (VIT D DEFICIENCY, FRACTURES): Vit D, 25-Hydroxy: 22 ng/mL — ABNORMAL LOW (ref 30–100)

## 2020-06-01 LAB — TSH: TSH: 2.2 mIU/L

## 2020-06-01 LAB — HEMOGLOBIN A1C
Hgb A1c MFr Bld: 5.5 % of total Hgb (ref <5.7)
Mean Plasma Glucose: 111 mg/dL
eAG (mmol/L): 6.2 mmol/L

## 2020-06-01 LAB — TESTOS,TOTAL,FREE AND SHBG (FEMALE)
Free Testosterone: 3.1 pg/mL (ref 0.1–6.4)
Sex Hormone Binding: 45 nmol/L (ref 17–124)
Testosterone, Total, LC-MS-MS: 29 ng/dL (ref 2–45)

## 2020-06-01 LAB — LIPID PANEL
Cholesterol: 167 mg/dL (ref <200)
HDL: 45 mg/dL — ABNORMAL LOW (ref >=50)
LDL Cholesterol (Calc): 98 mg/dL (calc)
Non-HDL Cholesterol (Calc): 122 mg/dL (calc) (ref <130)
Total CHOL/HDL Ratio: 3.7 (calc) (ref <5.0)
Triglycerides: 138 mg/dL (ref <150)

## 2020-06-01 LAB — ESTRADIOL: Estradiol: <15 pg/mL

## 2020-06-01 LAB — T3, FREE: T3, Free: 3.3 pg/mL (ref 2.3–4.2)

## 2020-06-01 LAB — DHEA-SULFATE: DHEA-SO4: 123 ug/dL (ref 5–167)

## 2020-06-01 LAB — PROGESTERONE: Progesterone: <0.5 ng/mL

## 2020-06-24 ENCOUNTER — Other Ambulatory Visit (INDEPENDENT_AMBULATORY_CARE_PROVIDER_SITE_OTHER): Payer: Self-pay | Admitting: Internal Medicine

## 2020-06-24 ENCOUNTER — Ambulatory Visit (INDEPENDENT_AMBULATORY_CARE_PROVIDER_SITE_OTHER): Payer: 59 | Admitting: Internal Medicine

## 2021-08-11 DEATH — deceased
